# Patient Record
Sex: Female | Born: 1970 | Race: White | Hispanic: No | Marital: Married | Smoking: Never smoker
Health system: Southern US, Community
[De-identification: ages and names within clinical notes are randomized; demographics above are authoritative.]

## PROBLEM LIST (undated history)

## (undated) DIAGNOSIS — M25512 Pain in left shoulder: Secondary | ICD-10-CM

## (undated) DIAGNOSIS — M1990 Unspecified osteoarthritis, unspecified site: Secondary | ICD-10-CM

## (undated) DIAGNOSIS — K635 Polyp of colon: Secondary | ICD-10-CM

## (undated) DIAGNOSIS — K2960 Other gastritis without bleeding: Secondary | ICD-10-CM

## (undated) DIAGNOSIS — Z0000 Encounter for general adult medical examination without abnormal findings: Secondary | ICD-10-CM

## (undated) DIAGNOSIS — K219 Gastro-esophageal reflux disease without esophagitis: Secondary | ICD-10-CM

## (undated) DIAGNOSIS — B019 Varicella without complication: Secondary | ICD-10-CM

## (undated) DIAGNOSIS — L918 Other hypertrophic disorders of the skin: Secondary | ICD-10-CM

## (undated) DIAGNOSIS — R911 Solitary pulmonary nodule: Secondary | ICD-10-CM

## (undated) DIAGNOSIS — G43909 Migraine, unspecified, not intractable, without status migrainosus: Secondary | ICD-10-CM

## (undated) DIAGNOSIS — M79671 Pain in right foot: Secondary | ICD-10-CM

## (undated) DIAGNOSIS — K649 Unspecified hemorrhoids: Secondary | ICD-10-CM

## (undated) DIAGNOSIS — J40 Bronchitis, not specified as acute or chronic: Secondary | ICD-10-CM

## (undated) DIAGNOSIS — N200 Calculus of kidney: Secondary | ICD-10-CM

## (undated) DIAGNOSIS — K5790 Diverticulosis of intestine, part unspecified, without perforation or abscess without bleeding: Secondary | ICD-10-CM

## (undated) DIAGNOSIS — I517 Cardiomegaly: Secondary | ICD-10-CM

## (undated) DIAGNOSIS — N83201 Unspecified ovarian cyst, right side: Secondary | ICD-10-CM

## (undated) DIAGNOSIS — R030 Elevated blood-pressure reading, without diagnosis of hypertension: Secondary | ICD-10-CM

## (undated) DIAGNOSIS — R05 Cough: Secondary | ICD-10-CM

## (undated) DIAGNOSIS — F418 Other specified anxiety disorders: Secondary | ICD-10-CM

## (undated) DIAGNOSIS — E669 Obesity, unspecified: Secondary | ICD-10-CM

## (undated) HISTORY — DX: Unspecified hemorrhoids: K64.9

## (undated) HISTORY — DX: Cough: R05

## (undated) HISTORY — DX: Unspecified ovarian cyst, right side: N83.201

## (undated) HISTORY — DX: Cardiomegaly: I51.7

## (undated) HISTORY — DX: Varicella without complication: B01.9

## (undated) HISTORY — DX: Other gastritis without bleeding: K29.60

## (undated) HISTORY — DX: Obesity, unspecified: E66.9

## (undated) HISTORY — DX: Solitary pulmonary nodule: R91.1

## (undated) HISTORY — DX: Pain in left shoulder: M25.512

## (undated) HISTORY — PX: TONSILECTOMY, ADENOIDECTOMY, BILATERAL MYRINGOTOMY AND TUBES: SHX2538

## (undated) HISTORY — DX: Bronchitis, not specified as acute or chronic: J40

## (undated) HISTORY — DX: Polyp of colon: K63.5

## (undated) HISTORY — DX: Unspecified osteoarthritis, unspecified site: M19.90

## (undated) HISTORY — DX: Gastro-esophageal reflux disease without esophagitis: K21.9

## (undated) HISTORY — DX: Pain in right foot: M79.671

## (undated) HISTORY — DX: Other specified anxiety disorders: F41.8

## (undated) HISTORY — DX: Other hypertrophic disorders of the skin: L91.8

## (undated) HISTORY — DX: Encounter for general adult medical examination without abnormal findings: Z00.00

## (undated) HISTORY — DX: Migraine, unspecified, not intractable, without status migrainosus: G43.909

## (undated) HISTORY — DX: Elevated blood-pressure reading, without diagnosis of hypertension: R03.0

## (undated) HISTORY — DX: Calculus of kidney: N20.0

## (undated) HISTORY — DX: Diverticulosis of intestine, part unspecified, without perforation or abscess without bleeding: K57.90

## (undated) HISTORY — PX: OVARIAN CYST REMOVAL: SHX89

---

## 2006-11-03 DIAGNOSIS — K2960 Other gastritis without bleeding: Secondary | ICD-10-CM

## 2006-11-03 DIAGNOSIS — K635 Polyp of colon: Secondary | ICD-10-CM

## 2006-11-03 DIAGNOSIS — K5790 Diverticulosis of intestine, part unspecified, without perforation or abscess without bleeding: Secondary | ICD-10-CM

## 2006-11-03 DIAGNOSIS — K219 Gastro-esophageal reflux disease without esophagitis: Secondary | ICD-10-CM

## 2006-11-03 HISTORY — DX: Polyp of colon: K63.5

## 2006-11-03 HISTORY — DX: Gastro-esophageal reflux disease without esophagitis: K21.9

## 2006-11-03 HISTORY — DX: Other gastritis without bleeding: K29.60

## 2006-11-03 HISTORY — DX: Diverticulosis of intestine, part unspecified, without perforation or abscess without bleeding: K57.90

## 2006-11-03 HISTORY — PX: LITHOTRIPSY: SUR834

## 2010-07-04 LAB — HM MAMMOGRAPHY

## 2010-07-04 LAB — HM PAP SMEAR

## 2010-09-24 ENCOUNTER — Ambulatory Visit: Payer: Self-pay | Admitting: Obstetrics & Gynecology

## 2010-10-08 ENCOUNTER — Encounter: Admission: RE | Admit: 2010-10-08 | Discharge: 2010-10-08 | Payer: Self-pay | Admitting: Anesthesiology

## 2011-02-19 ENCOUNTER — Encounter: Payer: Self-pay | Admitting: Family Medicine

## 2011-02-19 ENCOUNTER — Ambulatory Visit (INDEPENDENT_AMBULATORY_CARE_PROVIDER_SITE_OTHER): Payer: BC Managed Care – PPO | Admitting: Family Medicine

## 2011-02-19 VITALS — BP 114/72 | HR 72 | Ht 63.0 in | Wt 206.0 lb

## 2011-02-19 DIAGNOSIS — J309 Allergic rhinitis, unspecified: Secondary | ICD-10-CM | POA: Insufficient documentation

## 2011-02-19 DIAGNOSIS — G43009 Migraine without aura, not intractable, without status migrainosus: Secondary | ICD-10-CM

## 2011-02-19 DIAGNOSIS — Z309 Encounter for contraceptive management, unspecified: Secondary | ICD-10-CM

## 2011-02-19 MED ORDER — ETONOGESTREL-ETHINYL ESTRADIOL 0.12-0.015 MG/24HR VA RING: 1.0000 | VAGINAL_RING | VAGINAL | Status: DC

## 2011-02-19 MED ORDER — RIZATRIPTAN BENZOATE 10 MG PO TBDP: 10.0000 mg | ORAL_TABLET | ORAL | Status: DC | PRN

## 2011-02-19 MED ORDER — FLUTICASONE PROPIONATE 50 MCG/ACT NA SUSP: 2.0000 | Freq: Every day | NASAL | Status: DC

## 2011-02-19 NOTE — Assessment & Plan Note (Signed)
She can  continue nasal steroid but did recommend trying an oral antihistamine. She can also try nasal saline rinse twice a day to also help with the postnasal drip. If this does not improve her symptoms over the next month we can consider a nasal antihistamine.

## 2011-02-19 NOTE — Assessment & Plan Note (Signed)
Overall she is happy with her current regimen like a refill on Maxalt. She says she's tried several tryptans and rthis has worked best for her.

## 2011-02-19 NOTE — Progress Notes (Signed)
  Subjective:    Patient ID: Lisa Ray, female    DOB: 01-04-1971, 40 y.o.   MRN: [id number]  HPI  Has had chronic post nasal drip. Has severe allergies.  She has been using nasal steroid spray. She's not taking any means.  Her allergies have actually been better here.     Had her pap and mammo down the hall. Thinks she wasn't bith control.  Has had in IUD in the past.  She has gained a lot of weight in the last year.  Moved her from [place] for her job.  Her job is more sedentary.  No regular exercise.   Review of Systems  Constitutional: Negative for fever, diaphoresis and unexpected weight change.  HENT: Negative for hearing loss, rhinorrhea, sneezing, postnasal drip and tinnitus.   Eyes: Negative for visual disturbance.  Respiratory: Negative for cough and wheezing.   Cardiovascular: Negative for chest pain and palpitations.  Gastrointestinal: Negative for nausea, vomiting, abdominal pain, diarrhea and blood in stool.  Genitourinary: Negative for vaginal bleeding, vaginal discharge and difficulty urinating.  Musculoskeletal: Negative for myalgias and arthralgias.  Skin: Negative for rash.  Neurological: Positive for headaches.  Hematological: Negative for adenopathy. Does not bruise/bleed easily.  Psychiatric/Behavioral: Negative for sleep disturbance and dysphoric mood. The patient is not nervous/anxious.        Objective:   Physical Exam  Constitutional: She appears well-developed and well-nourished.  HENT:  Head: Normocephalic and atraumatic.  Neck: Neck supple. No thyromegaly present.  Cardiovascular: Normal rate, regular rhythm and normal heart sounds.   Pulmonary/Chest: Effort normal and breath sounds normal.  Lymphadenopathy:    She has no cervical adenopathy.  Skin: Skin is warm.  Psychiatric: She has a normal mood and affect.          Assessment & Plan:  Contraceptive counseling-we discussed different forms of birth control and she would like to try  the Nuvaring.

## 2011-03-18 NOTE — Assessment & Plan Note (Signed)
NAME:  Lisa Ray, Lisa Ray             ACCOUNT NO.:  [id number]   MEDICAL RECORD NO.:  [id number]          PATIENT TYPE:  POB   LOCATION:  [facility] [place]         FACILITY:  [facility]   PHYSICIAN:  Jaynie Collins, MD     DATE OF BIRTH:  Dec 29, 1970   DATE OF SERVICE:  09/24/2010                                  CLINIC NOTE   REASON FOR VISIT:  Annual examination, patient also wants preconceptual  counseling.   The patient is a 40 year old gravida 3, para 3 with a last menstrual  period of September 02, 2010, who is here for annual gynecologic exam.  The patient also reports that she wants to try to have another baby and  wants basic preconception counseling regarding this matter.  She was on  oral contraceptive pills for several years and have stopped it within  the last month.  She did have irregular bleeding after she stopped her  pills, but no other concerns.   PAST OBSTETRIC/GYNECOLOGIC HISTORY:  The patient is a G3, P3 with three  vaginal deliveries.  Her last pregnancy was 13 years ago.  As for her  menstrual history, she had menarche at age 25, she has regular menstrual  periods and her periods lasts for 4 days associated with medium flow and  mild pain and no intermenstrual bleeding, but patient does report she  has been on pills for a long time.  The patient denies having any  abnormal Pap smears or sexually transmitted infections.  Her last Pap  smear was in 2010.  She has had symptomatic ovarian cyst needing surgery  in 2008.   PAST MEDICAL HISTORY:  Diverticulitis, severe gastroesophageal reflux  disease/erosive gastritis, migraines.   PAST SURGICAL HISTORY:  Ovarian cystectomy and tonsillectomy.   MEDICATIONS:  Allegra, Aleve, Maxalt, and multivitamin.   ALLERGIES:  No known drug allergies.   SOCIAL HISTORY:  The patient works in HR.  She lives with her fiance,  three kids, and one grandson.  She does not smoke.  She drinks three  alcoholic beverages a week and does  not use any illicit drugs.  She  denies any past or current history of sexual or physical abuse.   FAMILY HISTORY:  Remarkable for her grandmother and grandfather having  colon and skin cancer and her mother and uncle having skin cancer.  She  does have an extensive family history of high blood pressure, heart  disease, and also her grandmother and her mother had blood clots in  their legs.   REVIEW OF SYSTEMS:  Unremarkable for headache and cough.   PHYSICAL EXAMINATION:  VITAL SIGNS:  Blood pressure is 115/73, pulse 92,  weight 203 pounds, height 62 inches.  GENERAL:  No apparent distress.  HEENT:  Normocephalic, atraumatic.  NECK:  Supple.  Normal thyroid.  LUNGS:  Clear to auscultation bilaterally.  HEART:  Regular rate and rhythm.  BREASTS:  Symmetric in size, nontender, no abnormal masses, skin  changes, lymphadenopathy, and nipple drainage noted.  ABDOMEN:  Soft,  nontender, and nondistended.  EXTREMITIES:  No cyanosis, clubbing, or edema.  PELVIC:  Normal external female genitalia.  Pink, well rugated vagina.  Normal cervical contour.  Pap smear  was obtained.  Uterus was not able  to be fully palpated secondary to habitus.  The adnexa also were not  fully palpated secondary to habitus, but no abnormal masses were  palpated.  No tenderness on bimanual examination.   ASSESSMENT AND PLAN:  The patient is a 40 year old gravida 3, para 3 who  is here for annual exam for preconception counseling.  As for her annual  exam, the patient did have a breast exam that was normal today and will  be scheduled for mammogram at the end of this visit.  We will also  follow up the results of her Pap smears.  The rest of her health care  maintenance is up-to-date.  As for her preconceptual counseling, the  patient was counseled regarding the risk of aneuploidy with advanced  maternal age and was given printed information about having over 50%  risk of having spontaneous abortion at age 52 and  an overall almost 16%  risk of having an aneuploidy.  There is also increased risk of maternal  morbidity and mortality and increased risk of stillbirths with advanced  maternal age.  The patient does verbalize understanding of this.  She  was told to make sure she is taking a multivitamin with at least 800 mcg  of folic acid in it and also to avoid anything that could potentially be  a teratogen.  The medications that she is on class B and class C  medications and she can continue on dose for now.  She does not have any  chronic medical conditions that are concerning at this point, but she  was told that she will be at increased risk for gestational diabetes,  hypertension, and other maternal conditions if and when she does get  pregnant.  The patient is going to get a flu vaccination today.  She was  told that she should try to get pregnant and but if she does not succeed  within 6 months, she will need further evaluation and might even need a  specialist referral after the basic evaluation is done here.  The use of  ovulation predictor kits was also reviewed and recommended.           ______________________________  Jaynie Collins, MD     UA/MEDQ  D:  09/24/2010  T:  09/25/2010  Job:  [id number]

## 2011-03-21 ENCOUNTER — Ambulatory Visit: Payer: BC Managed Care – PPO

## 2011-04-29 ENCOUNTER — Encounter: Payer: Self-pay | Admitting: Family Medicine

## 2011-04-29 ENCOUNTER — Inpatient Hospital Stay (INDEPENDENT_AMBULATORY_CARE_PROVIDER_SITE_OTHER)
Admission: RE | Admit: 2011-04-29 | Discharge: 2011-04-29 | Disposition: A | Discharge status: Home / Self Care | Payer: BC Managed Care – PPO | Source: Ambulatory Visit | Attending: Family Medicine | Admitting: Family Medicine

## 2011-04-29 ENCOUNTER — Other Ambulatory Visit: Payer: Self-pay | Admitting: Family Medicine

## 2011-04-29 ENCOUNTER — Ambulatory Visit
Admission: RE | Admit: 2011-04-29 | Discharge: 2011-04-29 | Disposition: A | Discharge status: Home / Self Care | Payer: BC Managed Care – PPO | Source: Ambulatory Visit | Attending: Family Medicine | Admitting: Family Medicine

## 2011-04-29 DIAGNOSIS — R109 Unspecified abdominal pain: Secondary | ICD-10-CM

## 2011-04-29 DIAGNOSIS — N200 Calculus of kidney: Secondary | ICD-10-CM | POA: Insufficient documentation

## 2011-04-29 LAB — CONVERTED CEMR LAB
Bilirubin Urine: NEGATIVE
Glucose, Urine, Semiquant: NEGATIVE
Ketones, urine, test strip: NEGATIVE
Nitrite: NEGATIVE
Protein, U semiquant: NEGATIVE
Specific Gravity, Urine: 1.01
Urobilinogen, UA: 0.2
WBC Urine, dipstick: NEGATIVE
pH: 5.5

## 2011-05-01 ENCOUNTER — Telehealth (INDEPENDENT_AMBULATORY_CARE_PROVIDER_SITE_OTHER): Payer: Self-pay | Admitting: Emergency Medicine

## 2011-05-04 DIAGNOSIS — N200 Calculus of kidney: Secondary | ICD-10-CM

## 2011-05-04 HISTORY — PX: OTHER SURGICAL HISTORY: SHX169

## 2011-05-04 HISTORY — DX: Calculus of kidney: N20.0

## 2011-05-29 ENCOUNTER — Telehealth: Payer: Self-pay | Admitting: Family Medicine

## 2011-05-29 NOTE — Telephone Encounter (Signed)
Pt called that she needs to come in for a wet prep for a nurse visit.  Sched while pt on phone. Jarvis Newcomer, LPN Domingo Dimes

## 2011-05-29 NOTE — Telephone Encounter (Signed)
Pt was recently given ab tx for kidney stones and has stint placement from a urologist.  Now she is complaining of itching and some discharge.  Uses [facility].  Wants to know if she can get script for diflucan 150 mg?  Please advise. Jarvis Newcomer, LPN Domingo Dimes

## 2011-05-29 NOTE — Telephone Encounter (Signed)
Needs to come for wet prep.  Can put on nurse visit .

## 2011-05-30 ENCOUNTER — Ambulatory Visit (INDEPENDENT_AMBULATORY_CARE_PROVIDER_SITE_OTHER): Payer: BC Managed Care – PPO | Admitting: Family Medicine

## 2011-05-30 ENCOUNTER — Telehealth: Payer: Self-pay | Admitting: *Deleted

## 2011-05-30 DIAGNOSIS — B3731 Acute candidiasis of vulva and vagina: Secondary | ICD-10-CM

## 2011-05-30 DIAGNOSIS — B373 Candidiasis of vulva and vagina: Secondary | ICD-10-CM

## 2011-05-30 MED ORDER — FLUCONAZOLE 150 MG PO TABS: 150.0000 mg | ORAL_TABLET | Freq: Once | ORAL | Status: AC

## 2011-05-30 NOTE — Telephone Encounter (Signed)
Ok will send over diflucan 

## 2011-05-30 NOTE — Telephone Encounter (Signed)
Pt came in the office today for a nurse visit for possible yeast infection. Very uncomfortable. Pt thought we could give her a ex today. Pt is going out of town. Adivsed pt that we would need to see what the results were on wet prep to confirm yeast before we could call in a rx. Pt wanted me to ask Dr. Any way if something could be called in since she was going out of town. Please advise

## 2011-05-30 NOTE — Telephone Encounter (Signed)
Left message on pt vm. 

## 2011-05-30 NOTE — Progress Notes (Signed)
  Subjective:    Patient ID: Lisa Ray, female    DOB: 03-May-1971, 40 y.o.   MRN: [id number]  HPI  Burning and itching in the vaginal area x 3 days. Minimal discharge  Review of Systems     Objective:   Physical Exam        Assessment & Plan:   Patient says she is leaving town [employer] [place]. So, and Diflucan I will call her Monday with the results of the wet prep.

## 2011-06-02 LAB — WET PREP, GENITAL: Clue Cells Wet Prep HPF POC: NONE SEEN

## 2011-06-03 ENCOUNTER — Telehealth: Payer: Self-pay | Admitting: Family Medicine

## 2011-06-03 NOTE — Telephone Encounter (Signed)
Pt.notified

## 2011-06-03 NOTE — Telephone Encounter (Signed)
Call pt: wet pre showed + yeast infection. Dfilucan should have cleared it up.

## 2011-10-06 NOTE — Progress Notes (Signed)
Summary: ? kidney stone rm 4   Vital Signs:  Patient Profile:   40 Years Old Female CC:      possible kidney stone LMP:     04/15/2011 Height:     62.5 inches Weight:      207.50 pounds O2 treatment:    Room Air Temp:     98.1 degrees F oral Pulse rate:   74 / minute Resp:     20 per minute BP sitting:   137 / 89  (left arm) Cuff size:   regular  Pt. in pain?   yes    Location:   lower back    Intensity:   8-9    Type:       sharp/ stabbing  Vitals Entered By: Clemens Catholic LPN (April 29, 2011 12:56 PM)  Menstrual History: LMP (date): 04/15/2011                   Updated Prior Medication List: No Medications Current Allergies: No known allergies History of Present Illness Chief Complaint: possible kidney stone History of Present Illness:  Subjective:  Patient complains of onset two hours ago of right lower back and flank pain associated with episode of nausea/vomiting.  No fevers, chills, and sweats.  No urinary symptoms.  She has a past history of right renal stone in 2008 that required stenting, and her present symptoms are similar.  REVIEW OF SYSTEMS Constitutional Symptoms      Denies fever, chills, night sweats, weight loss, weight gain, and fatigue.  Eyes       Denies change in vision, eye pain, eye discharge, glasses, contact lenses, and eye surgery. Ear/Nose/Throat/Mouth       Denies hearing loss/aids, change in hearing, ear pain, ear discharge, dizziness, frequent runny nose, frequent nose bleeds, sinus problems, sore throat, hoarseness, and tooth pain or bleeding.  Respiratory       Denies dry cough, productive cough, wheezing, shortness of breath, asthma, bronchitis, and emphysema/COPD.  Cardiovascular       Denies murmurs, chest pain, and tires easily with exhertion.    Gastrointestinal       Complains of stomach pain.      Denies nausea/vomiting, diarrhea, constipation, blood in bowel movements, and indigestion.      Comments:  nausea Genitourniary       Denies painful urination, kidney stones, and loss of urinary control. Neurological       Denies paralysis, seizures, and fainting/blackouts. Musculoskeletal       Denies muscle pain, joint pain, joint stiffness, decreased range of motion, redness, swelling, muscle weakness, and gout.  Skin       Denies bruising, unusual mles/lumps or sores, and hair/skin or nail changes.  Psych       Denies mood changes, temper/anger issues, anxiety/stress, speech problems, depression, and sleep problems. Other Comments: pt c/o RT sided low back pain radiating around to RLQ abd x 1 hour. she also is nauseated. she has a hx of kidney stones. [place] and her urologist was there. no OTC meds. no fever.   Past History:  Past Medical History: Unremarkable  Past Surgical History: ovarian cyst removal  Family History: skin CA and colon CA  Social History: Never Smoked Alcohol use-no Drug use-no Smoking Status:  never Drug Use:  no   Objective:  Appearance:  Patient appears uncomfortable, but otherwise healthy, stated age, and in no acute distress  Eyes:  Pupils are equal, round, and  reactive to light and accomodation.  Extraocular movement is intact.  Conjunctivae are not inflamed.  Mouth:  moist mucous membranes  Neck:  Supple.  No adenopathy is present.   Lungs:  Clear to auscultation.  Breath sounds are equal.  Heart:  Regular rate and rhythm without murmurs, rubs, or gallops.  Abdomen:  Mild tenderness right CVA and flank area without masses or hepatosplenomegaly.  Bowel sounds are present.    Extremities:  No edema.  Skin:  No rash urinalysis (dipstick):  2+ blood CT scan abdomen and pelvis (w/o contrast):  IMPRESSION: 2.5 mm stone in the mid right ureter at the L3-4 level causing mild right hydronephrosis.  Tiny stone in the upper pole of the right kidney.  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  Plan New Medications/Changes: PROMETHAZINE HCL 25 MG TABS (PROMETHAZINE HCL) 1 by mouth q4 to 6hr as needed nausea  #12 x 0, 04/29/2011, Donna Christen MD LORTAB 7.5 7.5-500 MG TABS (HYDROCODONE-ACETAMINOPHEN) 1 by mouth q6hr as needed pain  #12 (twelve) x 0, 04/29/2011, Donna Christen MD FLOMAX 0.4 MG CAPS (TAMSULOSIN HCL) One by mouth once daily pc  #10 x 1, 04/29/2011, Donna Christen MD  New Orders: T-CT Abdomen/pelvis w/o [16109] Ketorolac-Toradol 15mg  [U0454] Admin of Therapeutic Inj  intramuscular or subcutaneous [96372] Urinalysis [CPT-81003] New Patient Level V [99205] Planning Comments:   Toradol 60mg  IM.  Begin Flomax.  Increase fluid intake.  Strain urine.  Lortab and Phenergan for pain and nausea. Follow-up with urologist tomorrow.   The patient and/or caregiver has been counseled thoroughly with regard to medications prescribed including dosage, schedule, interactions, rationale for use, and possible side effects and they verbalize understanding.  Diagnoses and expected course of recovery discussed and will return if not improved as expected or if the condition worsens. Patient and/or caregiver verbalized understanding.  Prescriptions: PROMETHAZINE HCL 25 MG TABS (PROMETHAZINE HCL) 1 by mouth q4 to 6hr as needed nausea  #12 x 0   Entered and Authorized by:   Donna Christen MD   Signed by:   Donna Christen MD on 04/29/2011   Method used:   Print then Give to Patient   RxID:   [id number] LORTAB 7.5 7.5-500 MG TABS (HYDROCODONE-ACETAMINOPHEN) 1 by mouth q6hr as needed pain  #12 (twelve) x 0   Entered and Authorized by:   Donna Christen MD   Signed by:   Donna Christen MD on 04/29/2011   Method used:   Print then Give to Patient   RxID:   [id number] FLOMAX 0.4 MG CAPS (TAMSULOSIN HCL) One by mouth once daily pc  #10 x 1   Entered and Authorized by:    Donna Christen MD   Signed by:   Donna Christen MD on 04/29/2011   Method used:   Print then Give to Patient   RxID:   [id number]   Medication Administration  Injection # 1:    Medication: Ketorolac-Toradol 15mg     Diagnosis: NEPHROLITHIASIS, 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)    Route: IM    Site: LUOQ gluteus    Exp Date: 02/01/2013    Lot #: [id number]    Mfr: hospira    Comments: 60 mg given     Patient tolerated injection without complications    Given by: Clemens Catholic LPN (April 29, 2011 1:49 PM)  Orders Added: 1)  T-CT Abdomen/pelvis w/o [74176] 2)  Ketorolac-Toradol 15mg  [J1885] 3)  Admin  of Therapeutic Inj  intramuscular or subcutaneous [96372] 4)  Urinalysis [CPT-81003] 5)  New Patient Level V [99205]    Laboratory Results   Urine Tests  Date/Time Received: April 29, 2011 1:11 PM  Date/Time Reported: April 29, 2011 1:11 PM   Routine Urinalysis   Color: yellow Appearance: Clear Glucose: negative   (Normal Range: Negative) Bilirubin: negative   (Normal Range: Negative) Ketone: negative   (Normal Range: Negative) Spec. Gravity: 1.010   (Normal Range: 1.003-1.035) Blood: 2+   (Normal Range: Negative) pH: 5.5   (Normal Range: 5.0-8.0) Protein: negative   (Normal Range: Negative) Urobilinogen: 0.2   (Normal Range: 0-1) Nitrite: negative   (Normal Range: Negative) Leukocyte Esterace: negative   (Normal Range: Negative)

## 2011-10-06 NOTE — Telephone Encounter (Signed)
  Phone Note Outgoing Call Call back at [facility] Phone [phone] P [facility]     Call placed by: Emilio Math,  May 01, 2011 2:07 PM Call placed to: Patient Summary of Call: Left msg hope she is feeling better, call with questions or concerns.

## 2011-10-06 NOTE — Letter (Signed)
Summary: Out of Work  [facility]  [address]   [place], [place] [address]   Phone: [phone]  Fax: [phone]    April 29, 2011   Employee:  Lisa Ray    To Whom It May Concern:   For Medical reasons, please excuse the above named employee from work today and tomorrow.   If you need additional information, please feel free to contact our office.         Sincerely,    Donna Christen MD

## 2011-10-08 ENCOUNTER — Encounter: Payer: Self-pay | Admitting: Family Medicine

## 2011-10-08 ENCOUNTER — Other Ambulatory Visit: Payer: Self-pay | Admitting: Family Medicine

## 2011-10-08 ENCOUNTER — Ambulatory Visit (INDEPENDENT_AMBULATORY_CARE_PROVIDER_SITE_OTHER): Payer: BC Managed Care – PPO | Admitting: Family Medicine

## 2011-10-08 VITALS — BP 117/80 | HR 85 | Temp 97.9°F | Ht 63.0 in | Wt 208.8 lb

## 2011-10-08 DIAGNOSIS — R1013 Epigastric pain: Secondary | ICD-10-CM

## 2011-10-08 DIAGNOSIS — K5792 Diverticulitis of intestine, part unspecified, without perforation or abscess without bleeding: Secondary | ICD-10-CM | POA: Insufficient documentation

## 2011-10-08 DIAGNOSIS — K3189 Other diseases of stomach and duodenum: Secondary | ICD-10-CM

## 2011-10-08 DIAGNOSIS — G43909 Migraine, unspecified, not intractable, without status migrainosus: Secondary | ICD-10-CM | POA: Insufficient documentation

## 2011-10-08 DIAGNOSIS — N83201 Unspecified ovarian cyst, right side: Secondary | ICD-10-CM

## 2011-10-08 DIAGNOSIS — Z23 Encounter for immunization: Secondary | ICD-10-CM

## 2011-10-08 DIAGNOSIS — M79609 Pain in unspecified limb: Secondary | ICD-10-CM

## 2011-10-08 DIAGNOSIS — R109 Unspecified abdominal pain: Secondary | ICD-10-CM

## 2011-10-08 DIAGNOSIS — K2960 Other gastritis without bleeding: Secondary | ICD-10-CM | POA: Insufficient documentation

## 2011-10-08 DIAGNOSIS — N200 Calculus of kidney: Secondary | ICD-10-CM | POA: Insufficient documentation

## 2011-10-08 DIAGNOSIS — J309 Allergic rhinitis, unspecified: Secondary | ICD-10-CM

## 2011-10-08 DIAGNOSIS — E669 Obesity, unspecified: Secondary | ICD-10-CM | POA: Insufficient documentation

## 2011-10-08 DIAGNOSIS — Z0000 Encounter for general adult medical examination without abnormal findings: Secondary | ICD-10-CM

## 2011-10-08 DIAGNOSIS — M79671 Pain in right foot: Secondary | ICD-10-CM

## 2011-10-08 DIAGNOSIS — K635 Polyp of colon: Secondary | ICD-10-CM | POA: Insufficient documentation

## 2011-10-08 DIAGNOSIS — N83209 Unspecified ovarian cyst, unspecified side: Secondary | ICD-10-CM

## 2011-10-08 DIAGNOSIS — K219 Gastro-esophageal reflux disease without esophagitis: Secondary | ICD-10-CM

## 2011-10-08 HISTORY — DX: Obesity, unspecified: E66.9

## 2011-10-08 MED ORDER — RANITIDINE HCL 300 MG PO TABS: 300.0000 mg | ORAL_TABLET | Freq: Every day | ORAL | Status: DC

## 2011-10-08 MED ORDER — HYOSCYAMINE SULFATE 0.125 MG SL SUBL: 0.1250 mg | SUBLINGUAL_TABLET | SUBLINGUAL | Status: AC | PRN

## 2011-10-08 MED ORDER — RIZATRIPTAN BENZOATE 10 MG PO TABS: 10.0000 mg | ORAL_TABLET | ORAL | Status: DC | PRN

## 2011-10-08 NOTE — Patient Instructions (Signed)
Gastroesophageal Reflux Disease, Adult Gastroesophageal reflux disease (GERD) happens when acid from your stomach flows up into the esophagus. When acid comes in contact with the esophagus, the acid causes soreness (inflammation) in the esophagus. Over time, GERD may create small holes (ulcers) in the lining of the esophagus. CAUSES   Increased body weight. This puts pressure on the stomach, making acid rise from the stomach into the esophagus.   Smoking. This increases acid production in the stomach.   Drinking alcohol. This causes decreased pressure in the lower esophageal sphincter (valve or ring of muscle between the esophagus and stomach), allowing acid from the stomach into the esophagus.   Late evening meals and a full stomach. This increases pressure and acid production in the stomach.   A malformed lower esophageal sphincter.  Sometimes, no cause is found. SYMPTOMS   Burning pain in the lower part of the mid-chest behind the breastbone and in the mid-stomach area. This may occur twice a week or more often.   Trouble swallowing.   Sore throat.   Dry cough.   Asthma-like symptoms including chest tightness, shortness of breath, or wheezing.  DIAGNOSIS  Your caregiver may be able to diagnose GERD based on your symptoms. In some cases, X-rays and other tests may be done to check for complications or to check the condition of your stomach and esophagus. TREATMENT  Your caregiver may recommend over-the-counter or prescription medicines to help decrease acid production. Ask your caregiver before starting or adding any new medicines.  HOME CARE INSTRUCTIONS   Change the factors that you can control. Ask your caregiver for guidance concerning weight loss, quitting smoking, and alcohol consumption.   Avoid foods and drinks that make your symptoms worse, such as:   Caffeine or alcoholic drinks.   Chocolate.   Peppermint or mint flavorings.   Garlic and onions.   Spicy foods.     Citrus fruits, such as oranges, lemons, or limes.   Tomato-based foods such as sauce, chili, salsa, and pizza.   Fried and fatty foods.   Avoid lying down for the 3 hours prior to your bedtime or prior to taking a nap.   Eat small, frequent meals instead of large meals.   Wear loose-fitting clothing. Do not wear anything tight around your waist that causes pressure on your stomach.   Raise the head of your bed 6 to 8 inches with wood blocks to help you sleep. Extra pillows will not help.   Only take over-the-counter or prescription medicines for pain, discomfort, or fever as directed by your caregiver.   Do not take aspirin, ibuprofen, or other nonsteroidal anti-inflammatory drugs (NSAIDs).  SEEK IMMEDIATE MEDICAL CARE IF:   You have pain in your arms, neck, jaw, teeth, or back.   Your pain increases or changes in intensity or duration.   You develop nausea, vomiting, or sweating (diaphoresis).   You develop shortness of breath, or you faint.   Your vomit is green, yellow, black, or looks like coffee grounds or blood.   Your stool is red, bloody, or black.  These symptoms could be signs of other problems, such as heart disease, gastric bleeding, or esophageal bleeding. MAKE SURE YOU:   Understand these instructions.   Will watch your condition.   Will get help right away if you are not doing well or get worse.  Document Released: 07/30/2005 Document Revised: 07/02/2011 Document Reviewed: 05/09/2011 [facility] Patient Information 2012 [place], [place].   Avoid spicy foods Start a probiotic such  as Align caps daily, add a  Yogurt daily, consider minimizing gluten. Add A fiber supplement For right heel spur, apply ice twice daily and then apply Aspercreme, stretch, use good shoes and get gel shoe inserts

## 2011-10-09 LAB — HEPATIC FUNCTION PANEL
AST: 17 U/L (ref 0–37)
Albumin: 4.4 g/dL (ref 3.5–5.2)
Alkaline Phosphatase: 76 U/L (ref 39–117)
Total Protein: 7.1 g/dL (ref 6.0–8.3)

## 2011-10-09 LAB — CBC
Hemoglobin: 14.2 g/dL (ref 12.0–15.0)
MCH: 28.7 pg (ref 26.0–34.0)
MCV: 85.8 fL (ref 78.0–100.0)
WBC: 8.2 10*3/uL (ref 4.0–10.5)

## 2011-10-09 LAB — BASIC METABOLIC PANEL
BUN: 10 mg/dL (ref 6–23)
CO2: 25 mEq/L (ref 19–32)
Calcium: 9.7 mg/dL (ref 8.4–10.5)
Creat: 0.77 mg/dL (ref 0.50–1.10)
Potassium: 4.4 mEq/L (ref 3.5–5.3)
Sodium: 135 mEq/L (ref 135–145)

## 2011-10-09 LAB — LIPID PANEL: HDL: 49 mg/dL (ref >39)

## 2011-10-13 ENCOUNTER — Encounter: Payer: Self-pay | Admitting: Family Medicine

## 2011-10-13 DIAGNOSIS — N83201 Unspecified ovarian cyst, right side: Secondary | ICD-10-CM | POA: Insufficient documentation

## 2011-10-13 DIAGNOSIS — M79671 Pain in right foot: Secondary | ICD-10-CM

## 2011-10-13 DIAGNOSIS — Z0000 Encounter for general adult medical examination without abnormal findings: Secondary | ICD-10-CM

## 2011-10-13 HISTORY — DX: Encounter for general adult medical examination without abnormal findings: Z00.00

## 2011-10-13 HISTORY — DX: Pain in right foot: M79.671

## 2011-10-13 NOTE — Assessment & Plan Note (Addendum)
Tdap given today, avoid trans fats, get 7-8 sleep, try DASH diet.

## 2011-10-13 NOTE — Assessment & Plan Note (Signed)
Requesting a change to the Maxalt tab from the MLT. No trouble with frequent HA, encouraged adequate hydration, sleep and exercise

## 2011-10-13 NOTE — Assessment & Plan Note (Signed)
Pain at base of foot c/w fasciitis, encouraged no foot wear without backs, good shoes with arch support and gel shoe inserts, apply ice bid and Aspercreme, may use ALeve prn and report worsening symptoms

## 2011-10-13 NOTE — Progress Notes (Signed)
Lisa Ray [id number] 08/15/1971 10/13/2011      Progress Note New Patient  Subjective  Chief Complaint  Chief Complaint  Patient presents with  . Establish Care    new patient    HPI  40 year old Caucasian female who is in today for new patient appointment. Her major complaint is GI related. She'll long history of what sounds like irritable bowel symptoms with intermittent diarrhea and constipation. She reports that she'll have 4 loose crampiness bowel movements and then should go for several days without a bowel movement. She denies any bloody or tarry stool however. She reports nearly daily cramping in her lower abdomen intermittently. Sometimes it is severe it caused nausea and sweatiness and other times less severe. It is not associated with eating or bowel movements. She has a long history of intermittent migraines which are adequately controlled most of the time he stayed she is requesting a switch from Maxalt MLT to the regular pills however. She denies any recent fevers, chills, congestion, allergies, chest pain, palpitations, shortness of breath, GU complaints. She is struggling with some right foot pain she says this been going on for a month. 3 painful to step on her for foot especially first thing in the morning. When she is up for a while it does improve. She is using TUMS and Maalox when necessary heartburn with some affect. Gets a burning sour taste at times. She uses Aleve for her foot pain intermittently as well.  Past Medical History  Diagnosis Date  . Migraines   . Chicken pox as a child    X 2  . Kidney stones 7-12    X 3  . Diverticulitis 2008  . Colon polyps 2008  . GERD (gastroesophageal reflux disease) 2008  . Erosive inflammation of stomach lining 2008  . Obesity 10/08/2011  . Preventative health care 10/13/2011  . Foot pain, right 10/13/2011  . Ovarian cyst, right     Past Surgical History  Procedure Date  . Lithotripsy 2008  . Tonsilectomy,  adenoidectomy, bilateral myringotomy and tubes     Age 79  . Stent for kidney stone 7-12    Family History  Problem Relation Age of Onset  . Skin cancer    . Hypertension Mother   . Cancer Mother     skin, melanoma  . Migraines Mother   . Heart disease Maternal Grandmother   . Cancer Maternal Grandmother     colon  . Colon cancer Maternal Grandfather   . Cancer Maternal Grandfather     colon  . Bipolar disorder Daughter     History   Social History  . Marital Status: Married    Spouse Name: Johniya Durfee    Number of Children: 3  . Years of Education: Assoc degr   Occupational History  . human resources     [employer]   Social History Main Topics  . Smoking status: Never Smoker   . Smokeless tobacco: Never Used  . Alcohol Use: 1.5 oz/week    3 drink(s) per week     occasionally  . Drug Use: No  . Sexually Active: Yes -- Female partner(s)   Other Topics Concern  . Not on file   Social History Narrative   Lives with her partner Kiwana Deblasi.     Current Outpatient Prescriptions on File Prior to Visit  Medication Sig Dispense Refill  . fluticasone (FLONASE) 50 MCG/ACT nasal spray 2 sprays by Nasal route daily.  16 g  3  .  etonogestrel-ethinyl estradiol (NUVARING) 0.12-0.015 MG/24HR vaginal ring Place 1 each vaginally every 21 ( twenty-one) days. Insert one (1) ring vaginally and leave in place for three (3) weeks, then remove for one (1) week.  1 each  11    No Known Allergies  Review of Systems  Review of Systems  Constitutional: Negative for fever, chills and malaise/fatigue.  HENT: Negative for hearing loss, nosebleeds and congestion.   Eyes: Negative for discharge.  Respiratory: Negative for cough, sputum production, shortness of breath and wheezing.   Cardiovascular: Negative for chest pain, palpitations and leg swelling.  Gastrointestinal: Positive for heartburn, nausea, abdominal pain, diarrhea and constipation. Negative for vomiting and blood in stool.    Genitourinary: Negative for dysuria, urgency, frequency and hematuria.  Musculoskeletal: Positive for joint pain. Negative for myalgias, back pain and falls.       Right foot pain  Skin: Negative for rash.  Neurological: Positive for headaches. Negative for dizziness, tremors, sensory change, focal weakness, loss of consciousness and weakness.  Endo/Heme/Allergies: Negative for polydipsia. Does not bruise/bleed easily.  Psychiatric/Behavioral: Negative for depression and suicidal ideas. The patient is not nervous/anxious and does not have insomnia.      Objective  BP 117/80  Pulse 85  Temp(Src) 97.9 F (36.6 C) (Oral)  Ht 5\' 3"  (1.6 m)  Wt 208 lb 12.8 oz (94.711 kg)  BMI 36.99 kg/m2  SpO2 97%  LMP 09/08/2011  Physical Exam  Physical Exam  Constitutional: She is oriented to person, place, and time and well-developed, well-nourished, and in no distress. No distress.  HENT:  Head: Normocephalic and atraumatic.  Right Ear: External ear normal.  Left Ear: External ear normal.  Nose: Nose normal.  Mouth/Throat: Oropharynx is clear and moist. No oropharyngeal exudate.  Eyes: Conjunctivae are normal. Pupils are equal, round, and reactive to light. Right eye exhibits no discharge. Left eye exhibits no discharge. No scleral icterus.  Neck: Normal range of motion. Neck supple. No thyromegaly present.  Cardiovascular: Normal rate, regular rhythm, normal heart sounds and intact distal pulses.   No murmur heard. Pulmonary/Chest: Effort normal and breath sounds normal. No respiratory distress. She has no wheezes. She has no rales.  Abdominal: Soft. Bowel sounds are normal. She exhibits no distension and no mass. There is no tenderness.  Musculoskeletal: Normal range of motion. She exhibits no edema and no tenderness.  Lymphadenopathy:    She has no cervical adenopathy.  Neurological: She is alert and oriented to person, place, and time. She has normal reflexes. No cranial nerve deficit.  Coordination normal.  Skin: Skin is warm and dry. No rash noted. She is not diaphoretic.  Psychiatric: Mood, memory and affect normal.       Assessment & Plan  Migraines Requesting a change to the Maxalt tab from the MLT. No trouble with frequent HA, encouraged adequate hydration, sleep and exercise  GERD (gastroesophageal reflux disease) Describing IBS symptoms with alternating diarrhea and constipation, encouraged a probiotic and fiber supplement daily, may use Tams and Maalox prn for reflux symptoms and use Ranitidine prn. Avoid offending foods, report worsening symptoms. Describes some intermittent abdominal cramps, is given some Hyoscyamine to try prn  Allergic rhinitis Patient denies current symptoms  Preventative health care Tdap given today, avoid trans fats, get 7-8 sleep, try DASH diet.  Foot pain, right Pain at base of foot c/w fasciitis, encouraged no foot wear without backs, good shoes with arch support and gel shoe inserts, apply ice bid and Aspercreme, may use ALeve  prn and report worsening symptoms  Ovarian cyst, right No recurrence

## 2011-10-13 NOTE — Assessment & Plan Note (Addendum)
Describing IBS symptoms with alternating diarrhea and constipation, encouraged a probiotic and fiber supplement daily, may use Tams and Maalox prn for reflux symptoms and use Ranitidine prn. Avoid offending foods, report worsening symptoms. Describes some intermittent abdominal cramps, is given some Hyoscyamine to try prn

## 2011-10-13 NOTE — Assessment & Plan Note (Signed)
Patient denies current symptoms. 

## 2011-10-13 NOTE — Assessment & Plan Note (Signed)
No recurrence. 

## 2011-11-05 ENCOUNTER — Ambulatory Visit (INDEPENDENT_AMBULATORY_CARE_PROVIDER_SITE_OTHER): Payer: BC Managed Care – PPO | Admitting: Family Medicine

## 2011-11-05 ENCOUNTER — Encounter: Payer: Self-pay | Admitting: Family Medicine

## 2011-11-05 VITALS — BP 131/85 | HR 89 | Temp 97.8°F | Ht 63.0 in | Wt 211.8 lb

## 2011-11-05 DIAGNOSIS — E669 Obesity, unspecified: Secondary | ICD-10-CM

## 2011-11-05 DIAGNOSIS — J40 Bronchitis, not specified as acute or chronic: Secondary | ICD-10-CM

## 2011-11-05 HISTORY — DX: Bronchitis, not specified as acute or chronic: J40

## 2011-11-05 MED ORDER — HYDROCOD POLST-CHLORPHEN POLST 10-8 MG/5ML PO LQCR: 5.0000 mL | Freq: Every evening | ORAL | Status: DC | PRN

## 2011-11-05 MED ORDER — AZITHROMYCIN 250 MG PO TABS: ORAL_TABLET | ORAL | Status: DC

## 2011-11-05 MED ORDER — AZITHROMYCIN 250 MG PO TABS: ORAL_TABLET | ORAL | Status: AC

## 2011-11-05 MED ORDER — PHENTERMINE HCL 15 MG PO CAPS: 15.0000 mg | ORAL_CAPSULE | ORAL | Status: DC

## 2011-11-05 NOTE — Progress Notes (Signed)
Patient ID: Santo Held, female   DOB: 12-14-1970, 41 y.o.   MRN: [id number] Akasia Ahmad [id number] 08-Feb-1971 11/05/2011      Progress Note-Follow Up  Subjective  Chief Complaint  Chief Complaint  Patient presents with  . Follow-up    1 month follow up    HPI  Patient is a 41 year old Caucasian female who is in today for follow up a new patient appointment. His first treated her for persistent leaking. As he is phentermine in the past and would like to try it again. Denies any recent palpitations, chest pain, shortness of breath. Has been struggling with increased congestion and cough. Fatigue and malaise are also noted. Does think she is finally improving but has been sick for 2 weeks now. No obvious high-grade fevers or chills. She does have some head congestion but most were congested in her chest. Throat, GI or GU complaints noted today.  Past Medical History  Diagnosis Date  . Migraines   . Chicken pox as a child    X 2  . Kidney stones 7-12    X 3  . Diverticulitis 2008  . Colon polyps 2008  . GERD (gastroesophageal reflux disease) 2008  . Erosive inflammation of stomach lining 2008  . Obesity 10/08/2011  . Preventative health care 10/13/2011  . Foot pain, right 10/13/2011  . Ovarian cyst, right   . Bronchitis 11/05/2011    Past Surgical History  Procedure Date  . Lithotripsy 2008  . Tonsilectomy, adenoidectomy, bilateral myringotomy and tubes     Age 41  . Stent for kidney stone 7-12    Family History  Problem Relation Age of Onset  . Skin cancer    . Hypertension Mother   . Cancer Mother     skin, melanoma  . Migraines Mother   . Heart disease Maternal Grandmother   . Cancer Maternal Grandmother     colon  . Colon cancer Maternal Grandfather   . Cancer Maternal Grandfather     colon  . Bipolar disorder Daughter     History   Social History  . Marital Status: Married    Spouse Name: Porshia Blizzard    Number of Children: 3  . Years of Education:  Assoc degr   Occupational History  . human resources     [employer]   Social History Main Topics  . Smoking status: Never Smoker   . Smokeless tobacco: Never Used  . Alcohol Use: 1.5 oz/week    3 drink(s) per week     occasionally  . Drug Use: No  . Sexually Active: Yes -- Female partner(s)   Other Topics Concern  . Not on file   Social History Narrative   Lives with her partner Fatou Dunnigan.     Current Outpatient Prescriptions on File Prior to Visit  Medication Sig Dispense Refill  . fluticasone (FLONASE) 50 MCG/ACT nasal spray 2 sprays by Nasal route daily.  16 g  3  . ranitidine (ZANTAC) 300 MG tablet Take 1 tablet (300 mg total) by mouth at bedtime.  30 tablet  1  . rizatriptan (MAXALT) 10 MG tablet Take 1 tablet (10 mg total) by mouth as needed. May repeat in 2 hours if needed  18 tablet  1  . etonogestrel-ethinyl estradiol (NUVARING) 0.12-0.015 MG/24HR vaginal ring Place 1 each vaginally every 21 ( twenty-one) days. Insert one (1) ring vaginally and leave in place for three (3) weeks, then remove for one (1) week.  1 each  11  No Known Allergies  Review of Systems  Review of Systems  Constitutional: Positive for malaise/fatigue. Negative for fever.       Does acknowledge she does snore.  HENT: Positive for congestion.   Eyes: Negative for discharge.  Respiratory: Positive for cough and wheezing. Negative for shortness of breath.   Cardiovascular: Negative for chest pain, palpitations and leg swelling.  Gastrointestinal: Negative for nausea, abdominal pain and diarrhea.  Genitourinary: Negative for dysuria.  Musculoskeletal: Negative for falls.  Skin: Negative for rash.  Neurological: Negative for loss of consciousness and headaches.  Endo/Heme/Allergies: Negative for polydipsia.  Psychiatric/Behavioral: Negative for depression and suicidal ideas. The patient is not nervous/anxious and does not have insomnia.     Objective  BP 131/85  Pulse 89  Temp(Src) 97.8 F  (36.6 C) (Temporal)  Ht 5\' 3"  (1.6 m)  Wt 211 lb 12.8 oz (96.072 kg)  BMI 37.52 kg/m2  SpO2 96%  LMP 10/13/2011  Physical Exam  Physical Exam  Constitutional: She is oriented to person, place, and time and well-developed, well-nourished, and in no distress. No distress.  HENT:  Head: Normocephalic and atraumatic.  Eyes: Conjunctivae are normal.  Neck: Neck supple. No thyromegaly present.  Cardiovascular: Normal rate, regular rhythm and normal heart sounds.   No murmur heard. Pulmonary/Chest: Effort normal. She has wheezes.       Expiratory wheeze throughout  Abdominal: She exhibits no distension and no mass.  Musculoskeletal: She exhibits no edema.  Lymphadenopathy:    She has no cervical adenopathy.  Neurological: She is alert and oriented to person, place, and time.  Skin: Skin is warm and dry. No rash noted. She is not diaphoretic.  Psychiatric: Memory, affect and judgment normal.    Lab Results  Component Value Date   TSH 2.366 10/08/2011   Lab Results  Component Value Date   WBC 8.2 10/08/2011   HGB 14.2 10/08/2011   HCT 42.4 10/08/2011   MCV 85.8 10/08/2011   PLT 292 10/08/2011   Lab Results  Component Value Date   CREATININE 0.77 10/08/2011   BUN 10 10/08/2011   NA 135 10/08/2011   K 4.4 10/08/2011   CL 103 10/08/2011   CO2 25 10/08/2011   Lab Results  Component Value Date   ALT 11 10/08/2011   AST 17 10/08/2011   ALKPHOS 76 10/08/2011   BILITOT 0.4 10/08/2011   Lab Results  Component Value Date   CHOL 184 10/08/2011   Lab Results  Component Value Date   HDL 49 10/08/2011   Lab Results  Component Value Date   LDLCALC 105* 10/08/2011   Lab Results  Component Value Date   TRIG 152* 10/08/2011   Lab Results  Component Value Date   CHOLHDL 3.8 10/08/2011     Assessment & Plan  Obesity Given a handout on the DASH diet and started on Phentermine 15 mg daily and reassess in 2 weeks or as needed.  Bronchitis Continue mucinex, add antibiotics and cough  suppressant, increase fluids and rest and report if no improvement

## 2011-11-05 NOTE — Assessment & Plan Note (Signed)
Continue mucinex, add antibiotics and cough suppressant, increase fluids and rest and report if no improvement

## 2011-11-05 NOTE — Patient Instructions (Signed)

## 2011-11-05 NOTE — Assessment & Plan Note (Signed)
Given a handout on the DASH diet and started on Phentermine 15 mg daily and reassess in 2 weeks or as needed.

## 2011-11-13 ENCOUNTER — Encounter: Payer: Self-pay | Admitting: Gastroenterology

## 2011-11-19 ENCOUNTER — Ambulatory Visit (INDEPENDENT_AMBULATORY_CARE_PROVIDER_SITE_OTHER): Payer: BC Managed Care – PPO | Admitting: Family Medicine

## 2011-11-19 ENCOUNTER — Encounter: Payer: Self-pay | Admitting: Family Medicine

## 2011-11-19 VITALS — BP 123/81 | HR 76 | Temp 97.8°F | Ht 63.0 in | Wt 202.8 lb

## 2011-11-19 DIAGNOSIS — M79609 Pain in unspecified limb: Secondary | ICD-10-CM

## 2011-11-19 DIAGNOSIS — K219 Gastro-esophageal reflux disease without esophagitis: Secondary | ICD-10-CM

## 2011-11-19 DIAGNOSIS — J40 Bronchitis, not specified as acute or chronic: Secondary | ICD-10-CM

## 2011-11-19 DIAGNOSIS — M79671 Pain in right foot: Secondary | ICD-10-CM

## 2011-11-19 DIAGNOSIS — E669 Obesity, unspecified: Secondary | ICD-10-CM

## 2011-11-19 MED ORDER — PHENTERMINE HCL 37.5 MG PO CAPS: 37.5000 mg | ORAL_CAPSULE | ORAL | Status: DC

## 2011-11-19 NOTE — Assessment & Plan Note (Signed)
Reflux essentially resolved with dietary changes and weight loss, is not having to take Ranitidine any longer.

## 2011-11-19 NOTE — Assessment & Plan Note (Signed)
Patient with lingering cough, but other wise improved. Increase fluids, restart Mucinex and call if symptoms worsen

## 2011-11-19 NOTE — Progress Notes (Signed)
Patient ID: Lisa Ray, female   DOB: [birth year], 41 y.o.   MRN: [id number] Kyana Aicher [id number] 1971/09/09 11/19/2011      Progress Note-Follow Up  Subjective  Chief Complaint  Chief Complaint  Patient presents with  . Follow-up    2 week follow up on medication    HPI  Patient is a 41 year old female in today for followup nutrition appointment. She reports feeling much better since her last visit. She has made significant dietary changes and has lost weight. She also notes her reflux is essentially resolved she is no longer needing ranitidine. Her right foot pain is improved with stretching and topical treatments and she is pleased. No recent illness fevers chills. Her cough and shortness of breath greatly improved with azithromycin but she is still having some low-grade symptoms. No fevers, chills, headache palpitations, shortness of breath, GI or GU concerns or  Past Medical History  Diagnosis Date  . Migraines   . Chicken pox as a child    X 2  . Kidney stones 7-12    X 3  . Diverticulitis 2008  . Colon polyps 2008  . GERD (gastroesophageal reflux disease) 2008  . Erosive inflammation of stomach lining 2008  . Obesity 10/08/2011  . Preventative health care 10/13/2011  . Foot pain, right 10/13/2011  . Ovarian cyst, right   . Bronchitis 11/05/2011    Past Surgical History  Procedure Date  . Lithotripsy 2008  . Tonsilectomy, adenoidectomy, bilateral myringotomy and tubes     Age 83  . Stent for kidney stone 7-12    Family History  Problem Relation Age of Onset  . Skin cancer    . Hypertension Mother   . Cancer Mother     skin, melanoma  . Migraines Mother   . Heart disease Maternal Grandmother   . Cancer Maternal Grandmother     colon  . Colon cancer Maternal Grandfather   . Cancer Maternal Grandfather     colon  . Bipolar disorder Daughter     History   Social History  . Marital Status: Married    Spouse Name: Lyra Alaimo    Number of Children:  3  . Years of Education: Assoc degr   Occupational History  . human resources     [employer]   Social History Main Topics  . Smoking status: Never Smoker   . Smokeless tobacco: Never Used  . Alcohol Use: 1.5 oz/week    3 drink(s) per week     occasionally  . Drug Use: No  . Sexually Active: Yes -- Female partner(s)   Other Topics Concern  . Not on file   Social History Narrative   Lives with her partner Jaleigh Mccroskey.     Current Outpatient Prescriptions on File Prior to Visit  Medication Sig Dispense Refill  . etonogestrel-ethinyl estradiol (NUVARING) 0.12-0.015 MG/24HR vaginal ring Place 1 each vaginally every 21 ( twenty-one) days. Insert one (1) ring vaginally and leave in place for three (3) weeks, then remove for one (1) week.  1 each  11  . naproxen sodium (ANAPROX) 220 MG tablet Take 220 mg by mouth as needed.        . ranitidine (ZANTAC) 300 MG tablet Take 1 tablet (300 mg total) by mouth at bedtime.  30 tablet  1  . rizatriptan (MAXALT) 10 MG tablet Take 1 tablet (10 mg total) by mouth as needed. May repeat in 2 hours if needed  18 tablet  1  .  chlorpheniramine-HYDROcodone (TUSSIONEX PENNKINETIC ER) 10-8 MG/5ML LQCR Take 5 mLs by mouth at bedtime as needed.  140 mL  0  . fluticasone (FLONASE) 50 MCG/ACT nasal spray 2 sprays by Nasal route daily.  16 g  3    No Known Allergies  Review of Systems  Review of Systems  Constitutional: Negative for fever and malaise/fatigue.  HENT: Negative for congestion.   Eyes: Negative for discharge.  Respiratory: Negative for shortness of breath.   Cardiovascular: Negative for chest pain, palpitations and leg swelling.  Gastrointestinal: Negative for nausea, abdominal pain and diarrhea.  Genitourinary: Negative for dysuria.  Musculoskeletal: Negative for falls.  Skin: Negative for rash.  Neurological: Negative for loss of consciousness and headaches.  Endo/Heme/Allergies: Negative for polydipsia.  Psychiatric/Behavioral: Negative for  depression and suicidal ideas. The patient is not nervous/anxious and does not have insomnia.     Objective  BP 123/81  Pulse 76  Temp(Src) 97.8 F (36.6 C) (Temporal)  Ht 5\' 3"  (1.6 m)  Wt 202 lb 12.8 oz (91.989 kg)  BMI 35.92 kg/m2  SpO2 97%  LMP 11/18/2011  Physical Exam  Physical Exam  Constitutional: She is oriented to person, place, and time and well-developed, well-nourished, and in no distress. No distress.  HENT:  Head: Normocephalic and atraumatic.  Eyes: Conjunctivae are normal.  Neck: Neck supple. No thyromegaly present.  Cardiovascular: Normal rate, regular rhythm and normal heart sounds.   No murmur heard. Pulmonary/Chest: Effort normal and breath sounds normal. She has no wheezes.  Abdominal: She exhibits no distension and no mass.  Musculoskeletal: She exhibits no edema.  Lymphadenopathy:    She has no cervical adenopathy.  Neurological: She is alert and oriented to person, place, and time.  Skin: Skin is warm and dry. No rash noted. She is not diaphoretic.  Psychiatric: Memory, affect and judgment normal.    Lab Results  Component Value Date   TSH 2.366 10/08/2011   Lab Results  Component Value Date   WBC 8.2 10/08/2011   HGB 14.2 10/08/2011   HCT 42.4 10/08/2011   MCV 85.8 10/08/2011   PLT 292 10/08/2011   Lab Results  Component Value Date   CREATININE 0.77 10/08/2011   BUN 10 10/08/2011   NA 135 10/08/2011   K 4.4 10/08/2011   CL 103 10/08/2011   CO2 25 10/08/2011   Lab Results  Component Value Date   ALT 11 10/08/2011   AST 17 10/08/2011   ALKPHOS 76 10/08/2011   BILITOT 0.4 10/08/2011   Lab Results  Component Value Date   CHOL 184 10/08/2011   Lab Results  Component Value Date   HDL 49 10/08/2011   Lab Results  Component Value Date   LDLCALC 105* 10/08/2011   Lab Results  Component Value Date   TRIG 152* 10/08/2011   Lab Results  Component Value Date   CHOLHDL 3.8 10/08/2011     Assessment & Plan  Obesity Good weight loss with  low dose Phentermine, will increase dosing to 37.5 mg daily and reassess in 1mn  GERD (gastroesophageal reflux disease) Reflux essentially resolved with dietary changes and weight loss, is not having to take Ranitidine any longer.  Foot pain, right Nearly resolved with stretches,  Icing and treatments provided at last visit  Bronchitis Patient with lingering cough, but other wise improved. Increase fluids, restart Mucinex and call if symptoms worsen

## 2011-11-19 NOTE — Patient Instructions (Signed)

## 2011-11-19 NOTE — Assessment & Plan Note (Signed)
Nearly resolved with stretches,  Icing and treatments provided at last visit

## 2011-11-19 NOTE — Assessment & Plan Note (Addendum)
Good weight loss with low dose Phentermine, will increase dosing to 37.5 mg daily and reassess in 1mn

## 2011-12-03 ENCOUNTER — Encounter: Payer: Self-pay | Admitting: Gastroenterology

## 2011-12-03 ENCOUNTER — Ambulatory Visit (INDEPENDENT_AMBULATORY_CARE_PROVIDER_SITE_OTHER): Payer: BC Managed Care – PPO | Admitting: Gastroenterology

## 2011-12-03 ENCOUNTER — Other Ambulatory Visit: Payer: BC Managed Care – PPO

## 2011-12-03 VITALS — BP 110/70 | HR 62 | Ht 62.0 in | Wt 201.0 lb

## 2011-12-03 DIAGNOSIS — R109 Unspecified abdominal pain: Secondary | ICD-10-CM

## 2011-12-03 DIAGNOSIS — R197 Diarrhea, unspecified: Secondary | ICD-10-CM

## 2011-12-03 DIAGNOSIS — K219 Gastro-esophageal reflux disease without esophagitis: Secondary | ICD-10-CM

## 2011-12-03 NOTE — Patient Instructions (Addendum)
Please go to the basement today for your labs, Celiac Panel.  Complete the Hemoccult cards given to you in the office today.  Anti reflux information given today.  Follow up with Dr Russella Dar as needed.  Cc Dr. Abner Greenspan

## 2011-12-03 NOTE — Progress Notes (Signed)
History of Present Illness: This is a 41 year old female who relates a several year history of lower abdominal pain associated with urgent, watery, nonbloody diarrhea. Her symptoms gradually worsened over the past few months. She was evaluated by Dr. Abner Greenspan in conjunction with a weight loss program. Her abdominal pain has resolved and she is now having 2 formed bowel movements each day. She states she underwent upper endoscopy and colonoscopy in 2008 [place] showing colon polyps erosive gastritis and erosive esophagitis. Reflux had been previously well controlled on Zantac 300 mg daily but her recent dietary changes she is found to symptoms have resolved not been taking ranitidine. Denies weight loss, constipation, change in stool caliber, melena, hematochezia, nausea, vomiting, dysphagia, chest pain.  Review of Systems: Pertinent positive and negative review of systems were noted in the above HPI section. All other review of systems were otherwise negative.  Current Medications, Allergies, Past Medical History, Past Surgical History, Family History and Social History were reviewed in [employer] record.  Physical Exam: General: Well developed , well nourished, no acute distress Head: Normocephalic and atraumatic Eyes:  sclerae anicteric, EOMI Ears: Normal auditory acuity Mouth: No deformity or lesions Neck: Supple, no masses or thyromegaly Lungs: Clear throughout to auscultation Heart: Regular rate and rhythm; no murmurs, rubs or bruits Abdomen: Soft, non tender and non distended. No masses, hepatosplenomegaly or hernias noted. Normal Bowel sounds Musculoskeletal: Symmetrical with no gross deformities  Skin: No lesions on visible extremities Pulses:  Normal pulses noted Extremities: No clubbing, cyanosis, edema or deformities noted Neurological: Alert oriented x 4, grossly nonfocal Cervical Nodes:  No significant cervical adenopathy Inguinal Nodes: No significant  inguinal adenopathy Psychological:  Alert and cooperative. Normal mood and affect  Assessment and Recommendations:  1. Diarrhea and lower abdominal pain. I suspect this is food intolerances as her symptoms have essentially resolved with diet changes. Rule out celiac disease. Obtain a celiac panel and stool Hemoccults. Will attempt to obtain records from her prior endoscopy and colonoscopy. We'll determine followup interval for colonoscopy when her records are received.  2. History of GERD with erosive esophagitis and erosive gastritis. Continue all standard antireflux measures and resume ranitidine 300 mg daily or twice a day if symptoms return.

## 2011-12-04 LAB — CELIAC PANEL 10
Endomysial Screen: NEGATIVE
IgA: 287 mg/dL (ref 69–380)
Tissue Transglutaminase Ab, IgA: 5.2 U/mL (ref <20)

## 2011-12-10 ENCOUNTER — Encounter: Payer: Self-pay | Admitting: Family Medicine

## 2011-12-10 ENCOUNTER — Ambulatory Visit (INDEPENDENT_AMBULATORY_CARE_PROVIDER_SITE_OTHER): Payer: BC Managed Care – PPO | Admitting: Family Medicine

## 2011-12-10 DIAGNOSIS — J40 Bronchitis, not specified as acute or chronic: Secondary | ICD-10-CM

## 2011-12-10 DIAGNOSIS — E669 Obesity, unspecified: Secondary | ICD-10-CM

## 2011-12-10 DIAGNOSIS — K219 Gastro-esophageal reflux disease without esophagitis: Secondary | ICD-10-CM

## 2011-12-10 MED ORDER — HYDROCOD POLST-CHLORPHEN POLST 10-8 MG/5ML PO LQCR: 5.0000 mL | Freq: Two times a day (BID) | ORAL | Status: DC | PRN

## 2011-12-10 MED ORDER — PHENTERMINE HCL 37.5 MG PO CAPS: 37.5000 mg | ORAL_CAPSULE | ORAL | Status: DC

## 2011-12-10 NOTE — Patient Instructions (Signed)
Tachycardia, Nonspecific In adults, the heart normally beats between 60 and 100 times a minute. A heart rate over 100 is called tachycardia. When your heart beats too fast, it may not be able to pump enough blood to the rest of the body. CAUSES   Exercise or exertion.   Fever.   Pain or injury.   Infection.   Loss of fluid (dehydration).   Overactive thyroid.   Lack of red blood cells (anemia).   Anxiety.   Alcohol.   Heart arrhythmia.   Caffeine.   Tobacco products.   Diet pills.   Street drugs.   Heart disease.  SYMPTOMS  Palpitations (rapid or irregular heartbeat).   Dizziness.   Tiredness (fatigue).   Shortness of breath.  DIAGNOSIS  After an exam and taking a history, your caregiver may order:  Blood tests.   Electrocardiogram (EKG).   Heart monitor.  TREATMENT  Treatment will depend on the cause and potential for harm. It may include:  Intravenous (IV) replacement of fluids or blood.   Antidote or reversal medicines.   Changes in your present medicines.   Lifestyle changes.  HOME CARE INSTRUCTIONS   Get rest.   Drink enough water and fluids to keep your urine clear or pale yellow.   Avoid:   Caffeine.   Nicotine.   Alcohol.   Stress.   Chocolate.   Stimulants.   Only take medicine as directed by your caregiver.  SEEK IMMEDIATE MEDICAL CARE IF:   You have pain in your chest, upper arms, jaw, or neck.   You become weak, dizzy, or feel faint.   You have palpitations that will not go away.   You throw up (vomit), have diarrhea, or pass blood.   You look pale and your skin is cool and wet.  MAKE SURE YOU:   Understand these instructions.   Will watch your condition.   Will get help right away if you are not doing well or get worse.  Document Released: 11/27/2004 Document Revised: 07/02/2011 Document Reviewed: 10/20/2005 [employer] Patient Information 2012 [employer]. 

## 2011-12-10 NOTE — Assessment & Plan Note (Signed)
Has had good appetite suppression and would like to continue her Phentermine for now, she is given refills and taught how to check her pulse if she feels it is running hi, she is to call us for guidance if it is elevated above 100 consistently

## 2011-12-10 NOTE — Assessment & Plan Note (Signed)
Improved but not fully has actually taken 2 courses of antibiotics since she saw [place] last. She had to have a tooth pulled and her dentist put her on 10 days of PCN which she has just completed. She still feels congested but when she's able to get any mucus is clear. She's had no fevers chills. She is encouraged to start Mucinex twice a day and she is given a refill on Tussionex to use each bedtime, increase fluids and report if symptoms worsen to

## 2011-12-10 NOTE — Progress Notes (Signed)
Patient ID: Lisa Ray, female   DOB: 19-Jun-1971, 41 y.o.   MRN: [id number] Sheril Hammond [id number] 1971-06-08 12/10/2011      Progress Note-Follow Up  Subjective  Chief Complaint  Chief Complaint  Patient presents with  . Follow-up    3 week follow up  . Cough    w/ phlegm (clear)    HPI  Patient is a 41 year old caucasian female in today for follow up on her weight. She has been taking the phentermine routinely and doesn't believe it has helped her appetite suppression. She has lost weight and is feeling pleased with this. She denies any sense of palpitations, chest pain shortness of breath. She has some increased anxiety but acknowledges largely this is due to his temper with her daughters. Her 48 year old daughter is become more labile and irritable, is starting to have some angry outbursts. Her older daughter has Re: been diagnosed with bipolar disorder she finds herself very anxious about her younger daughter's future. She used to struggle with stress at work as well. She had her GI workup and is pleased with the results. Happy that she doesn't have celiac disease and presently her stomach is somewhat better. She had a tooth pulled and she was last seen and had a second course of antibiotics as a result. She denies any fevers or chills but her cough is present still. Lots of clear phlegm. No headache or ear pain, chest pain, palpitations or shortness of breath. Off continues to keep her up at night.  Past Medical History  Diagnosis Date  . Migraines   . Chicken pox as a child    X 2  . Kidney stones 7-12    X 3  . Diverticulosis 2008  . Colon polyps 2008  . GERD (gastroesophageal reflux disease) 2008  . Erosive inflammation of stomach lining 2008  . Obesity 10/08/2011  . Preventative health care 10/13/2011  . Foot pain, right 10/13/2011  . Ovarian cyst, right   . Bronchitis 11/05/2011    Past Surgical History  Procedure Date  . Lithotripsy 2008  . Tonsilectomy,  adenoidectomy, bilateral myringotomy and tubes     Age 41  . Stent for kidney stone 7-12  . Ovarian cyst removal     Family History  Problem Relation Age of Onset  . Hypertension Mother   . Cancer Mother     skin, melanoma  . Migraines Mother   . Heart disease Maternal Grandmother   . Colon cancer Maternal Grandmother   . Colon cancer Maternal Grandfather   . Colon cancer Maternal Grandfather   . Bipolar disorder Daughter     History   Social History  . Marital Status: Married    Spouse Name: Sylena Lotter    Number of Children: 3  . Years of Education: Assoc degr   Occupational History  . human resources     [employer]    Social History Main Topics  . Smoking status: Never Smoker   . Smokeless tobacco: Never Used  . Alcohol Use: 1.5 oz/week    3 drink(s) per week     occasionally  . Drug Use: No  . Sexually Active: Yes -- Female partner(s)   Other Topics Concern  . Not on file   Social History Narrative   Lives with her partner Yeraldi Fidler.     Current Outpatient Prescriptions on File Prior to Visit  Medication Sig Dispense Refill  . naproxen sodium (ANAPROX) 220 MG tablet Take 220 mg by  mouth as needed.        . rizatriptan (MAXALT) 10 MG tablet Take 1 tablet (10 mg total) by mouth as needed. May repeat in 2 hours if needed  18 tablet  1  . fluticasone (FLONASE) 50 MCG/ACT nasal spray 2 sprays by Nasal route daily.  16 g  3    No Known Allergies  Review of Systems  Review of Systems  Constitutional: Negative for fever and malaise/fatigue.  HENT: Positive for congestion.   Eyes: Negative for discharge.  Respiratory: Positive for cough. Negative for shortness of breath.   Cardiovascular: Negative for chest pain, palpitations and leg swelling.  Gastrointestinal: Negative for nausea, abdominal pain and diarrhea.  Genitourinary: Negative for dysuria.  Musculoskeletal: Negative for falls.  Skin: Negative for rash.  Neurological: Negative for loss of  consciousness and headaches.  Endo/Heme/Allergies: Negative for polydipsia.  Psychiatric/Behavioral: Negative for depression and suicidal ideas. The patient is nervous/anxious. The patient does not have insomnia.     Objective  BP 121/84  Pulse 78  Temp(Src) 99.8 F (37.7 C) (Temporal)  Ht 5\' 3"  (1.6 m)  Wt 199 lb 1.9 oz (90.32 kg)  BMI 35.27 kg/m2  SpO2 96%  LMP 12/10/2011  Physical Exam  Physical Exam  Constitutional: She is oriented to person, place, and time and well-developed, well-nourished, and in no distress. No distress.  HENT:  Head: Normocephalic and atraumatic.  Eyes: Conjunctivae are normal.  Neck: Neck supple. No thyromegaly present.  Cardiovascular: Normal rate, regular rhythm and normal heart sounds.   No murmur heard. Pulmonary/Chest: Effort normal and breath sounds normal. She has no wheezes.  Abdominal: She exhibits no distension and no mass.  Musculoskeletal: She exhibits no edema.  Lymphadenopathy:    She has no cervical adenopathy.  Neurological: She is alert and oriented to person, place, and time.  Skin: Skin is warm and dry. No rash noted. She is not diaphoretic.  Psychiatric: Memory, affect and judgment normal.    Lab Results  Component Value Date   TSH 2.366 10/08/2011   Lab Results  Component Value Date   WBC 8.2 10/08/2011   HGB 14.2 10/08/2011   HCT 42.4 10/08/2011   MCV 85.8 10/08/2011   PLT 292 10/08/2011   Lab Results  Component Value Date   CREATININE 0.77 10/08/2011   BUN 10 10/08/2011   NA 135 10/08/2011   K 4.4 10/08/2011   CL 103 10/08/2011   CO2 25 10/08/2011   Lab Results  Component Value Date   ALT 11 10/08/2011   AST 17 10/08/2011   ALKPHOS 76 10/08/2011   BILITOT 0.4 10/08/2011   Lab Results  Component Value Date   CHOL 184 10/08/2011   Lab Results  Component Value Date   HDL 49 10/08/2011   Lab Results  Component Value Date   LDLCALC 105* 10/08/2011   Lab Results  Component Value Date   TRIG 152* 10/08/2011   Lab  Results  Component Value Date   CHOLHDL 3.8 10/08/2011     Assessment & Plan  Bronchitis Improved but not fully has actually taken 2 courses of antibiotics since she saw [place] last. She had to have a tooth pulled and her dentist put her on 10 days of PCN which she has just completed. She still feels congested but when she's able to get any mucus is clear. She's had no fevers chills. She is encouraged to start Mucinex twice a day and she is given a refill on Tussionex to  use each bedtime, increase fluids and report if symptoms worsen to  GERD (gastroesophageal reflux disease) Improved and has completed a GI work up and all of her testing came back negative. She was very pleased with her care and reassured  Obesity Has had good appetite suppression and would like to continue her Phentermine for now, she is given refills and taught how to check her pulse if she feels it is running hi, she is to call us for guidance if it is elevated above 100 consistently

## 2011-12-10 NOTE — Assessment & Plan Note (Signed)
Improved and has completed a GI work up and all of her testing came back negative. She was very pleased with her care and reassured

## 2011-12-31 ENCOUNTER — Ambulatory Visit (INDEPENDENT_AMBULATORY_CARE_PROVIDER_SITE_OTHER): Payer: BC Managed Care – PPO | Admitting: Family Medicine

## 2011-12-31 ENCOUNTER — Encounter: Payer: Self-pay | Admitting: Family Medicine

## 2011-12-31 VITALS — BP 134/82 | HR 91 | Temp 98.9°F | Ht 63.0 in | Wt 193.8 lb

## 2011-12-31 DIAGNOSIS — M549 Dorsalgia, unspecified: Secondary | ICD-10-CM

## 2011-12-31 DIAGNOSIS — M25519 Pain in unspecified shoulder: Secondary | ICD-10-CM

## 2011-12-31 DIAGNOSIS — M25512 Pain in left shoulder: Secondary | ICD-10-CM

## 2011-12-31 MED ORDER — NAPROXEN 500 MG PO TABS: 500.0000 mg | ORAL_TABLET | Freq: Two times a day (BID) | ORAL | Status: DC

## 2011-12-31 MED ORDER — HYDROCODONE-ACETAMINOPHEN 5-325 MG PO TABS: 1.0000 | ORAL_TABLET | Freq: Three times a day (TID) | ORAL | Status: AC | PRN

## 2011-12-31 MED ORDER — CYCLOBENZAPRINE HCL 10 MG PO TABS: 10.0000 mg | ORAL_TABLET | Freq: Two times a day (BID) | ORAL | Status: AC | PRN

## 2011-12-31 NOTE — Patient Instructions (Signed)
Back Pain, Adult Low back pain is very common. About 1 in 5 people have back pain.The cause of low back pain is rarely dangerous. The pain often gets better over time.About half of people with a sudden onset of back pain feel better in just 2 weeks. About 8 in 10 people feel better by 6 weeks.  CAUSES Some common causes of back pain include:  Strain of the muscles or ligaments supporting the spine.   Wear and tear (degeneration) of the spinal discs.   Arthritis.   Direct injury to the back.  DIAGNOSIS Most of the time, the direct cause of low back pain is not known.However, back pain can be treated effectively even when the exact cause of the pain is unknown.Answering your caregiver's questions about your overall health and symptoms is one of the most accurate ways to make sure the cause of your pain is not dangerous. If your caregiver needs more information, he or she may order lab work or imaging tests (X-rays or MRIs).However, even if imaging tests show changes in your back, this usually does not require surgery. HOME CARE INSTRUCTIONS For many people, back pain returns.Since low back pain is rarely dangerous, it is often a condition that people can learn to manageon their own.   Remain active. It is stressful on the back to sit or stand in one place. Do not sit, drive, or stand in one place for more than 30 minutes at a time. Take short walks on level surfaces as soon as pain allows.Try to increase the length of time you walk each day.   Do not stay in bed.Resting more than 1 or 2 days can delay your recovery.   Do not avoid exercise or work.Your body is made to move.It is not dangerous to be active, even though your back may hurt.Your back will likely heal faster if you return to being active before your pain is gone.   Pay attention to your body when you bend and lift. Many people have less discomfortwhen lifting if they bend their knees, keep the load close to their  bodies,and avoid twisting. Often, the most comfortable positions are those that put less stress on your recovering back.   Find a comfortable position to sleep. Use a firm mattress and lie on your side with your knees slightly bent. If you lie on your back, put a pillow under your knees.   Only take over-the-counter or prescription medicines as directed by your caregiver. Over-the-counter medicines to reduce pain and inflammation are often the most helpful.Your caregiver may prescribe muscle relaxant drugs.These medicines help dull your pain so you can more quickly return to your normal activities and healthy exercise.   Put ice on the injured area.   Put ice in a plastic bag.   Place a towel between your skin and the bag.   Leave the ice on for 15 to 20 minutes, 3 to 4 times a day for the first 2 to 3 days. After that, ice and heat may be alternated to reduce pain and spasms.   Ask your caregiver about trying back exercises and gentle massage. This may be of some benefit.   Avoid feeling anxious or stressed.Stress increases muscle tension and can worsen back pain.It is important to recognize when you are anxious or stressed and learn ways to manage it.Exercise is a great option.  SEEK MEDICAL CARE IF:  You have pain that is not relieved with rest or medicine.   You have   pain that does not improve in 1 week.   You have new symptoms.   You are generally not feeling well.  SEEK IMMEDIATE MEDICAL CARE IF:   You have pain that radiates from your back into your legs.   You develop new bowel or bladder control problems.   You have unusual weakness or numbness in your arms or legs.   You develop nausea or vomiting.   You develop abdominal pain.   You feel faint.  Document Released: 10/20/2005 Document Revised: 07/02/2011 Document Reviewed: 03/10/2011 [facility] Patient Information 2012 [place] [place], [place].  Moist heat and gentle stretching twice daily for a week and then 1 x  daily

## 2012-01-04 ENCOUNTER — Encounter: Payer: Self-pay | Admitting: Family Medicine

## 2012-01-04 DIAGNOSIS — M25512 Pain in left shoulder: Secondary | ICD-10-CM

## 2012-01-04 HISTORY — DX: Pain in left shoulder: M25.512

## 2012-01-04 NOTE — Assessment & Plan Note (Addendum)
Presents about one week. Unclear etiology. Denies any injury. Pain worse with movement. Likely bursitis or strain. Rest, naproxen and Flexeril are prescribed. She will notify us if symptoms persist and may need physical therapy or referral to orthopedics if symptoms worsen or do not resolve.

## 2012-01-04 NOTE — Progress Notes (Signed)
Patient ID: Lisa Ray, female   DOB: 1971-01-07, 41 y.o.   MRN: [id number] Lisa Ray [id number] 09-Apr-1971 01/04/2012      Progress Note-Follow Up  Subjective  Chief Complaint  Chief Complaint  Patient presents with  . Shoulder Injury    can't lift arm - left X 1 week (unsure what happened)    HPI  Week history of left shoulder pain. She denies any fall or acute injury. No radicular symptoms. Pain is worse with extreme movements and lifting. No neurologic complaints. No history of similar injury. No other complaints such as neck pain, fevers, chills, chest pain, palpitations or shortness of breath.  Past Medical History  Diagnosis Date  . Migraines   . Chicken pox as a child    X 2  . Kidney stones 7-12    X 3  . Diverticulosis 2008  . Colon polyps 2008  . GERD (gastroesophageal reflux disease) 2008  . Erosive inflammation of stomach lining 2008  . Obesity 10/08/2011  . Preventative health care 10/13/2011  . Foot pain, right 10/13/2011  . Ovarian cyst, right   . Bronchitis 11/05/2011  . Left shoulder pain 01/04/2012    Past Surgical History  Procedure Date  . Lithotripsy 2008  . Tonsilectomy, adenoidectomy, bilateral myringotomy and tubes     Age 89  . Stent for kidney stone 7-12  . Ovarian cyst removal     Family History  Problem Relation Age of Onset  . Hypertension Mother   . Cancer Mother     skin, melanoma  . Migraines Mother   . Heart disease Maternal Grandmother   . Colon cancer Maternal Grandmother   . Colon cancer Maternal Grandfather   . Colon cancer Maternal Grandfather   . Bipolar disorder Daughter     History   Social History  . Marital Status: Married    Spouse Name: Desere Gwin    Number of Children: 3  . Years of Education: Assoc degr   Occupational History  . human resources     [employer]    Social History Main Topics  . Smoking status: Never Smoker   . Smokeless tobacco: Never Used  . Alcohol Use: 1.5 oz/week    3 drink(s) per  week     occasionally  . Drug Use: No  . Sexually Active: Yes -- Female partner(s)   Other Topics Concern  . Not on file   Social History Narrative   Lives with her partner Aloise Copus.     Current Outpatient Prescriptions on File Prior to Visit  Medication Sig Dispense Refill  . naproxen sodium (ANAPROX) 220 MG tablet Take 220 mg by mouth as needed.        . phentermine 37.5 MG capsule Take 1 capsule (37.5 mg total) by mouth every morning.  30 capsule  2  . chlorpheniramine-HYDROcodone (TUSSIONEX) 10-8 MG/5ML LQCR Take 5 mLs by mouth every 12 (twelve) hours as needed.  140 mL  1  . fluticasone (FLONASE) 50 MCG/ACT nasal spray 2 sprays by Nasal route daily.  16 g  3  . rizatriptan (MAXALT) 10 MG tablet Take 1 tablet (10 mg total) by mouth as needed. May repeat in 2 hours if needed  18 tablet  1    No Known Allergies  Review of Systems  Review of Systems  Constitutional: Negative for fever and malaise/fatigue.  HENT: Negative for congestion and neck pain.   Eyes: Negative for discharge.  Respiratory: Negative for shortness of breath.  Cardiovascular: Negative for chest pain, palpitations and leg swelling.  Gastrointestinal: Negative for nausea, abdominal pain and diarrhea.  Genitourinary: Negative for dysuria.  Musculoskeletal: Positive for joint pain. Negative for myalgias, back pain and falls.       Left shoulder pain. Roughly 1 week. No radiculopathy  Skin: Negative for rash.  Neurological: Negative for loss of consciousness and headaches.  Endo/Heme/Allergies: Negative for polydipsia.  Psychiatric/Behavioral: Negative for depression and suicidal ideas. The patient is not nervous/anxious and does not have insomnia.     Objective  BP 134/82  Pulse 91  Temp(Src) 98.9 F (37.2 C) (Temporal)  Ht 5\' 3"  (1.6 m)  Wt 193 lb 12.8 oz (87.907 kg)  BMI 34.33 kg/m2  SpO2 97%  LMP 12/10/2011  Physical Exam  Physical Exam  Constitutional: She is oriented to person, place,  and time and well-developed, well-nourished, and in no distress. No distress.  HENT:  Head: Normocephalic and atraumatic.  Eyes: Conjunctivae are normal.  Neck: Neck supple. No thyromegaly present.  Cardiovascular: Normal rate, regular rhythm and normal heart sounds.   No murmur heard. Pulmonary/Chest: Effort normal and breath sounds normal. She has no wheezes.  Abdominal: She exhibits no distension and no mass.  Musculoskeletal: She exhibits tenderness. She exhibits no edema.       Slight pain with palp over lateral left shoulder.   Lymphadenopathy:    She has no cervical adenopathy.  Neurological: She is alert and oriented to person, place, and time.  Skin: Skin is warm and dry. No rash noted. She is not diaphoretic.  Psychiatric: Memory, affect and judgment normal.    Lab Results  Component Value Date   TSH 2.366 10/08/2011   Lab Results  Component Value Date   WBC 8.2 10/08/2011   HGB 14.2 10/08/2011   HCT 42.4 10/08/2011   MCV 85.8 10/08/2011   PLT 292 10/08/2011   Lab Results  Component Value Date   CREATININE 0.77 10/08/2011   BUN 10 10/08/2011   NA 135 10/08/2011   K 4.4 10/08/2011   CL 103 10/08/2011   CO2 25 10/08/2011   Lab Results  Component Value Date   ALT 11 10/08/2011   AST 17 10/08/2011   ALKPHOS 76 10/08/2011   BILITOT 0.4 10/08/2011   Lab Results  Component Value Date   CHOL 184 10/08/2011   Lab Results  Component Value Date   HDL 49 10/08/2011   Lab Results  Component Value Date   LDLCALC 105* 10/08/2011   Lab Results  Component Value Date   TRIG 152* 10/08/2011   Lab Results  Component Value Date   CHOLHDL 3.8 10/08/2011     Assessment & Plan  Left shoulder pain Presents about one week. Unclear etiology. Denies any injury. Pain worse with movement. Likely bursitis or strain. Rest, naproxen and Flexeril are prescribed. She will notify us if symptoms persist and may need physical therapy or referral to orthopedics if symptoms worsen or do not  resolve.

## 2012-05-31 ENCOUNTER — Ambulatory Visit (INDEPENDENT_AMBULATORY_CARE_PROVIDER_SITE_OTHER): Payer: BC Managed Care – PPO | Admitting: Family Medicine

## 2012-05-31 ENCOUNTER — Ambulatory Visit (INDEPENDENT_AMBULATORY_CARE_PROVIDER_SITE_OTHER)
Admission: RE | Admit: 2012-05-31 | Discharge: 2012-05-31 | Disposition: A | Discharge status: Home / Self Care | Payer: BC Managed Care – PPO | Source: Ambulatory Visit | Attending: Family Medicine | Admitting: Family Medicine

## 2012-05-31 ENCOUNTER — Encounter: Payer: Self-pay | Admitting: Family Medicine

## 2012-05-31 VITALS — BP 113/79 | HR 92 | Temp 98.4°F | Resp 16 | Ht 62.0 in | Wt 180.8 lb

## 2012-05-31 DIAGNOSIS — L909 Atrophic disorder of skin, unspecified: Secondary | ICD-10-CM

## 2012-05-31 DIAGNOSIS — K219 Gastro-esophageal reflux disease without esophagitis: Secondary | ICD-10-CM

## 2012-05-31 DIAGNOSIS — R059 Cough, unspecified: Secondary | ICD-10-CM

## 2012-05-31 DIAGNOSIS — E669 Obesity, unspecified: Secondary | ICD-10-CM

## 2012-05-31 DIAGNOSIS — R05 Cough: Secondary | ICD-10-CM

## 2012-05-31 DIAGNOSIS — IMO0001 Reserved for inherently not codable concepts without codable children: Secondary | ICD-10-CM

## 2012-05-31 DIAGNOSIS — L919 Hypertrophic disorder of the skin, unspecified: Secondary | ICD-10-CM

## 2012-05-31 DIAGNOSIS — L918 Other hypertrophic disorders of the skin: Secondary | ICD-10-CM

## 2012-05-31 MED ORDER — PHENTERMINE HCL 37.5 MG PO CAPS: 37.5000 mg | ORAL_CAPSULE | ORAL | Status: DC

## 2012-05-31 MED ORDER — OMEPRAZOLE 20 MG PO CPDR: 20.0000 mg | DELAYED_RELEASE_CAPSULE | Freq: Every day | ORAL | Status: DC | PRN

## 2012-05-31 NOTE — Patient Instructions (Addendum)

## 2012-06-01 ENCOUNTER — Other Ambulatory Visit: Payer: Self-pay | Admitting: Family Medicine

## 2012-06-01 ENCOUNTER — Encounter: Payer: Self-pay | Admitting: Family Medicine

## 2012-06-01 DIAGNOSIS — L918 Other hypertrophic disorders of the skin: Secondary | ICD-10-CM

## 2012-06-01 DIAGNOSIS — R05 Cough: Secondary | ICD-10-CM

## 2012-06-01 DIAGNOSIS — J984 Other disorders of lung: Secondary | ICD-10-CM

## 2012-06-01 DIAGNOSIS — R059 Cough, unspecified: Secondary | ICD-10-CM

## 2012-06-01 HISTORY — DX: Other hypertrophic disorders of the skin: L91.8

## 2012-06-01 HISTORY — DX: Cough, unspecified: R05.9

## 2012-06-01 NOTE — Assessment & Plan Note (Signed)
cxr shows a possible nodule and cough is persistent. Check a CT chest.

## 2012-06-01 NOTE — Assessment & Plan Note (Signed)
Tolerating Phentermine and has been eating well and staying active. She takes the Phentermine as needed not on all days

## 2012-06-01 NOTE — Progress Notes (Signed)
Patient ID: Lisa Ray, female   DOB: 26-Apr-1971, 41 y.o.   MRN: [id number] Lisa Ray [id number] 09/22/1971 06/01/2012      Progress Note-Follow Up  Subjective  Chief Complaint  Chief Complaint  Patient presents with  . Follow-up    Medication Check: Phentermine  . Post Nasal Drip    Pt c/o post nasal drip [w/o sinus congestion and/or chest phlegm][  . Nevus    Pt request to have moles on back checked    HPI  Patient is a 41 year old Caucasian female who is in today for followup. She has been eating well and taking phentermine intermittently and is pleased with her weight loss. Overall says he feels well. No fevers or chills. Does note she has a persistent cough and feels as if there is congestion in her chest. Coughing intermittently productive of some white phlegm. No significant sinus congestion but she does note some postnasal drip. No headache or ear pain. No sore throat. No chest pain or palpitations, shortness of breath GI or GU complaints  Past Medical History  Diagnosis Date  . Migraines   . Chicken pox as a child    X 2  . Kidney stones 7-12    X 3  . Diverticulosis 2008  . Colon polyps 2008  . GERD (gastroesophageal reflux disease) 2008  . Erosive inflammation of stomach lining 2008  . Obesity 10/08/2011  . Preventative health care 10/13/2011  . Foot pain, right 10/13/2011  . Ovarian cyst, right   . Bronchitis 11/05/2011  . Left shoulder pain 01/04/2012  . Skin tag 06/01/2012  . Cough 06/01/2012    Past Surgical History  Procedure Date  . Lithotripsy 2008  . Tonsilectomy, adenoidectomy, bilateral myringotomy and tubes     Age 41  . Stent for kidney stone 7-12  . Ovarian cyst removal     Family History  Problem Relation Age of Onset  . Hypertension Mother   . Cancer Mother     skin, melanoma  . Migraines Mother   . Heart disease Maternal Grandmother   . Colon cancer Maternal Grandmother   . Colon cancer Maternal Grandfather   . Colon cancer  Maternal Grandfather   . Bipolar disorder Daughter     History   Social History  . Marital Status: Married    Spouse Name: Cielle Aguila    Number of Children: 3  . Years of Education: Assoc degr   Occupational History  . human resources     [employer]    Social History Main Topics  . Smoking status: Never Smoker   . Smokeless tobacco: Never Used  . Alcohol Use: 1.5 oz/week    3 drink(s) per week     occasionally  . Drug Use: No  . Sexually Active: Yes -- Female partner(s)   Other Topics Concern  . Not on file   Social History Narrative   Lives with her partner Naiomi Musto.     Current Outpatient Prescriptions on File Prior to Visit  Medication Sig Dispense Refill  . naproxen sodium (ANAPROX) 220 MG tablet Take 220 mg by mouth as needed.        . phentermine 37.5 MG capsule Take 1 capsule (37.5 mg total) by mouth every morning.  30 capsule  2  . rizatriptan (MAXALT) 10 MG tablet Take 1 tablet (10 mg total) by mouth as needed. May repeat in 2 hours if needed  18 tablet  1  . Acetaminophen-Aspirin Buffered 250-250 MG tablet Take  2 tablets by mouth at bedtime as needed.      . chlorpheniramine-HYDROcodone (TUSSIONEX) 10-8 MG/5ML LQCR Take 5 mLs by mouth every 12 (twelve) hours as needed.  140 mL  1  . fluticasone (FLONASE) 50 MCG/ACT nasal spray 2 sprays by Nasal route daily.  16 g  3  . omeprazole (PRILOSEC) 20 MG capsule Take 1 capsule (20 mg total) by mouth daily as needed (reflux).  30 capsule  3  . ranitidine (ZANTAC) 300 MG tablet         No Known Allergies  Review of Systems  Review of Systems  Constitutional: Positive for weight loss. Negative for fever and malaise/fatigue.  HENT: Positive for congestion.   Eyes: Negative for discharge.  Respiratory: Positive for cough. Negative for shortness of breath.   Cardiovascular: Negative for chest pain, palpitations and leg swelling.  Gastrointestinal: Negative for nausea, abdominal pain and diarrhea.  Genitourinary:  Negative for dysuria.  Musculoskeletal: Negative for falls.  Skin: Negative for rash.  Neurological: Positive for headaches. Negative for loss of consciousness.  Endo/Heme/Allergies: Negative for polydipsia.  Psychiatric/Behavioral: Negative for depression and suicidal ideas. The patient is not nervous/anxious and does not have insomnia.     Objective  BP 113/79  Pulse 92  Temp 98.4 F (36.9 C) (Oral)  Resp 16  Ht 5\' 2"  (1.575 m)  Wt 180 lb 12 oz (81.988 kg)  BMI 33.06 kg/m2  SpO2 98%  LMP 05/24/2012  Physical Exam  Physical Exam  Constitutional: She is oriented to person, place, and time and well-developed, well-nourished, and in no distress. No distress.  HENT:  Head: Normocephalic and atraumatic.  Right Ear: External ear normal.  Left Ear: External ear normal.  Nose: Nose normal.  Mouth/Throat: Oropharynx is clear and moist. No oropharyngeal exudate.  Eyes: Conjunctivae are normal. Pupils are equal, round, and reactive to light. Right eye exhibits no discharge. Left eye exhibits no discharge. No scleral icterus.  Neck: Normal range of motion. Neck supple. No thyromegaly present.  Cardiovascular: Normal rate, regular rhythm, normal heart sounds and intact distal pulses.   No murmur heard. Pulmonary/Chest: Effort normal and breath sounds normal. No respiratory distress. She has no wheezes. She has no rales.  Abdominal: Soft. Bowel sounds are normal. She exhibits no distension and no mass. There is no tenderness.  Musculoskeletal: Normal range of motion. She exhibits no edema.  Lymphadenopathy:    She has no cervical adenopathy.  Neurological: She is alert and oriented to person, place, and time. She has normal reflexes.  Skin: Skin is warm and dry. No rash noted. She is not diaphoretic.  Psychiatric: Mood, memory and affect normal.    Lab Results  Component Value Date   TSH 2.366 10/08/2011   Lab Results  Component Value Date   WBC 8.2 10/08/2011   HGB 14.2  10/08/2011   HCT 42.4 10/08/2011   MCV 85.8 10/08/2011   PLT 292 10/08/2011   Lab Results  Component Value Date   CREATININE 0.77 10/08/2011   BUN 10 10/08/2011   NA 135 10/08/2011   K 4.4 10/08/2011   CL 103 10/08/2011   CO2 25 10/08/2011   Lab Results  Component Value Date   ALT 11 10/08/2011   AST 17 10/08/2011   ALKPHOS 76 10/08/2011   BILITOT 0.4 10/08/2011   Lab Results  Component Value Date   CHOL 184 10/08/2011   Lab Results  Component Value Date   HDL 49 10/08/2011   Lab Results  Component Value Date   LDLCALC 105* 10/08/2011   Lab Results  Component Value Date   TRIG 152* 10/08/2011   Lab Results  Component Value Date   CHOLHDL 3.8 10/08/2011     Assessment & Plan  Skin tag Right posterior shoulder, no concerns   Obesity Tolerating Phentermine and has been eating well and staying active. She takes the Phentermine as needed not on all days  Cough cxr shows a possible nodule and cough is persistent. Check a CT chest.

## 2012-06-01 NOTE — Assessment & Plan Note (Signed)
Right posterior shoulder, no concerns

## 2012-06-03 ENCOUNTER — Telehealth: Payer: Self-pay

## 2012-06-03 ENCOUNTER — Other Ambulatory Visit (HOSPITAL_BASED_OUTPATIENT_CLINIC_OR_DEPARTMENT_OTHER): Payer: BC Managed Care – PPO

## 2012-06-03 ENCOUNTER — Ambulatory Visit (HOSPITAL_BASED_OUTPATIENT_CLINIC_OR_DEPARTMENT_OTHER)
Admission: RE | Admit: 2012-06-03 | Discharge: 2012-06-03 | Disposition: A | Discharge status: Home / Self Care | Payer: BC Managed Care – PPO | Source: Ambulatory Visit | Attending: Family Medicine | Admitting: Family Medicine

## 2012-06-03 DIAGNOSIS — R05 Cough: Secondary | ICD-10-CM | POA: Insufficient documentation

## 2012-06-03 DIAGNOSIS — J984 Other disorders of lung: Secondary | ICD-10-CM

## 2012-06-03 DIAGNOSIS — R059 Cough, unspecified: Secondary | ICD-10-CM

## 2012-06-03 NOTE — Telephone Encounter (Signed)
[facility] called stating pt is getting a CT today but there radiologist recommended without contrast and wanted to know if this was ok?  I informed her that this was ok

## 2012-06-04 ENCOUNTER — Encounter: Payer: Self-pay | Admitting: Family Medicine

## 2012-12-18 ENCOUNTER — Other Ambulatory Visit: Payer: Self-pay

## 2013-03-14 ENCOUNTER — Telehealth: Payer: Self-pay | Admitting: Family

## 2013-03-14 ENCOUNTER — Ambulatory Visit (INDEPENDENT_AMBULATORY_CARE_PROVIDER_SITE_OTHER): Payer: Federal, State, Local not specified - PPO | Admitting: Family

## 2013-03-14 ENCOUNTER — Ambulatory Visit (HOSPITAL_BASED_OUTPATIENT_CLINIC_OR_DEPARTMENT_OTHER)
Admission: RE | Admit: 2013-03-14 | Discharge: 2013-03-14 | Disposition: A | Discharge status: Home / Self Care | Payer: Federal, State, Local not specified - PPO | Source: Ambulatory Visit | Attending: Family | Admitting: Family

## 2013-03-14 ENCOUNTER — Encounter: Payer: Self-pay | Admitting: Family

## 2013-03-14 VITALS — BP 116/84 | HR 81 | Temp 98.2°F | Resp 16 | Ht 62.0 in | Wt 192.1 lb

## 2013-03-14 DIAGNOSIS — R911 Solitary pulmonary nodule: Secondary | ICD-10-CM | POA: Insufficient documentation

## 2013-03-14 DIAGNOSIS — R109 Unspecified abdominal pain: Secondary | ICD-10-CM | POA: Insufficient documentation

## 2013-03-14 DIAGNOSIS — K429 Umbilical hernia without obstruction or gangrene: Secondary | ICD-10-CM | POA: Insufficient documentation

## 2013-03-14 DIAGNOSIS — G44229 Chronic tension-type headache, not intractable: Secondary | ICD-10-CM

## 2013-03-14 DIAGNOSIS — K5730 Diverticulosis of large intestine without perforation or abscess without bleeding: Secondary | ICD-10-CM | POA: Insufficient documentation

## 2013-03-14 DIAGNOSIS — G5620 Lesion of ulnar nerve, unspecified upper limb: Secondary | ICD-10-CM | POA: Insufficient documentation

## 2013-03-14 DIAGNOSIS — Z0189 Encounter for other specified special examinations: Secondary | ICD-10-CM

## 2013-03-14 DIAGNOSIS — G43909 Migraine, unspecified, not intractable, without status migrainosus: Secondary | ICD-10-CM

## 2013-03-14 DIAGNOSIS — N200 Calculus of kidney: Secondary | ICD-10-CM | POA: Insufficient documentation

## 2013-03-14 LAB — POCT URINE HCG BY VISUAL COLOR COMPARISON TESTS: Preg Test, Ur: NEGATIVE

## 2013-03-14 LAB — POCT URINALYSIS DIPSTICK
Bilirubin, UA: NEGATIVE
Glucose, UA: NEGATIVE
Nitrite, UA: NEGATIVE

## 2013-03-14 MED ORDER — CIPROFLOXACIN HCL 500 MG PO TABS: 500.0000 mg | ORAL_TABLET | Freq: Two times a day (BID) | ORAL | Status: DC

## 2013-03-14 MED ORDER — RIZATRIPTAN BENZOATE 10 MG PO TABS: 10.0000 mg | ORAL_TABLET | ORAL | Status: DC | PRN

## 2013-03-14 NOTE — Progress Notes (Signed)
Subjective:    Patient ID: Lisa Ray, female    DOB: 07/19/1971, 42 y.o.   MRN: [id number]  HPI  Lisa Ray is a 42 yr old female who presents today with several concerns.  1) Flank pain- pain is bilateral and is constant.  Pain started 1 week ago. She has hx of recurrent nephrolithaisis.  Spotting from period.  Denies fever. Drinking lots of water last few months.    2) Wrist pain- reports stiffness of both hands.  Works at a [employer].  Pain radiates up to her elbows.  3) Headache- She reports that she continues to have frequent tension headaches.  Reports that migraines are infrequent and well controlled with use of PRN maxalt.      Review of Systems See HPI  Past Medical History  Diagnosis Date  . Migraines   . Chicken pox as a child    X 2  . Kidney stones 7-12    X 3  . Diverticulosis 2008  . Colon polyps 2008  . GERD (gastroesophageal reflux disease) 2008  . Erosive inflammation of stomach lining 2008  . Obesity 10/08/2011  . Preventative health care 10/13/2011  . Foot pain, right 10/13/2011  . Ovarian cyst, right   . Bronchitis 11/05/2011  . Left shoulder pain 01/04/2012  . Skin tag 06/01/2012  . Cough 06/01/2012    History   Social History  . Marital Status: Married    Spouse Name: Lisa Ray    Number of Children: 3  . Years of Education: Assoc degr   Occupational History  . human resources     [employer]    Social History Main Topics  . Smoking status: Never Smoker   . Smokeless tobacco: Never Used  . Alcohol Use: 1.5 oz/week    3 drink(s) per week     Comment: occasionally  . Drug Use: No  . Sexually Active: Yes -- Female partner(s)   Other Topics Concern  . Not on file   Social History Narrative   Lives with her partner Lisa Ray.           Past Surgical History  Procedure Laterality Date  . Lithotripsy  2008  . Tonsilectomy, adenoidectomy, bilateral myringotomy and tubes      Age 14  . Stent for kidney stone  7-12  . Ovarian cyst  removal      Family History  Problem Relation Age of Onset  . Hypertension Mother   . Cancer Mother     skin, melanoma  . Migraines Mother   . Heart disease Maternal Grandmother   . Colon cancer Maternal Grandmother   . Colon cancer Maternal Grandfather   . Colon cancer Maternal Grandfather   . Bipolar disorder Daughter     No Known Allergies  Current Outpatient Prescriptions on File Prior to Visit  Medication Sig Dispense Refill  . chlorpheniramine-HYDROcodone (TUSSIONEX) 10-8 MG/5ML LQCR Take 5 mLs by mouth every 12 (twelve) hours as needed.  140 mL  1  . omeprazole (PRILOSEC) 20 MG capsule Take 1 capsule (20 mg total) by mouth daily as needed (reflux).  30 capsule  3  . Acetaminophen-Aspirin Buffered 250-250 MG tablet Take 2 tablets by mouth at bedtime as needed.       No current facility-administered medications on file prior to visit.    BP 116/84  Pulse 81  Temp(Src) 98.2 F (36.8 C) (Oral)  Resp 16  Ht 5\' 2"  (1.575 m)  Wt 192 lb 1.9 oz (87.145  kg)  BMI 35.13 kg/m2  SpO2 99%  LMP 03/14/2013       Objective:   Physical Exam  Constitutional: She is oriented to person, place, and time. She appears well-developed and well-nourished. No distress.  HENT:  Head: Normocephalic and atraumatic.  Cardiovascular: Normal rate and regular rhythm.   No murmur heard. Pulmonary/Chest: Effort normal and breath sounds normal. No respiratory distress. She has no wheezes. She has no rales. She exhibits no tenderness.  Musculoskeletal: She exhibits no edema.  Neg phalans, neg tinels.  Lymphadenopathy:    She has no cervical adenopathy.  Neurological: She is alert and oriented to person, place, and time.  Skin: Skin is warm and dry.  Psychiatric: She has a normal mood and affect. Her behavior is normal. Judgment and thought content normal.          Assessment & Plan:

## 2013-03-14 NOTE — Assessment & Plan Note (Signed)
Bilateral.  Pt reports leaning her forearms on her desk due to configuration of keyboard.  We discussed that she is likely causing irritation of the ulnar nerves which is causing her ongoing pain.  Recommended that she rearrange desk for better body mechanics.

## 2013-03-14 NOTE — Assessment & Plan Note (Addendum)
UA notes + blood, + leuks.  CT abd pelvis, neg for obstructing stone.  Will plan to culture urine, plan empiric rx with cipro.  Pain could be MS in nature.   CT did note diverticulosis (not new) and 2mm RLL lung nodule in this non smoker.  No further work up needed given low risk.  Incidental not also made of very small inguinal hernia.  She is asymptomatic.  We reviewed s/s of strangulated hernia and pt understands need to go to the ED if these symptoms occur.

## 2013-03-14 NOTE — Assessment & Plan Note (Signed)
Recommended sparing use of aleve.  Otherwise use PRN tylenol.

## 2013-03-14 NOTE — Patient Instructions (Addendum)
Please complete your CT scan on the first floor. Call if symptoms worsen or if not improved in 2-3 days.

## 2013-03-14 NOTE — Telephone Encounter (Signed)
Called pt. Reviewed CT scan.  Note 2mm lung nodule. She is a non-smoker.  Low risk.  Advised pt to call if symptoms worsen, or if not improved in 2-3 days.  Start cipro.  Pt verbalizes understanding.

## 2013-03-15 LAB — URINE CULTURE
Colony Count: NO GROWTH
Organism ID, Bacteria: NO GROWTH

## 2013-03-23 ENCOUNTER — Encounter: Payer: Self-pay | Admitting: Family Medicine

## 2013-03-23 ENCOUNTER — Ambulatory Visit (INDEPENDENT_AMBULATORY_CARE_PROVIDER_SITE_OTHER): Payer: Federal, State, Local not specified - PPO | Admitting: Family Medicine

## 2013-03-23 VITALS — BP 118/82 | HR 73 | Temp 98.1°F | Ht 63.0 in | Wt 194.0 lb

## 2013-03-23 DIAGNOSIS — K5730 Diverticulosis of large intestine without perforation or abscess without bleeding: Secondary | ICD-10-CM

## 2013-03-23 DIAGNOSIS — K429 Umbilical hernia without obstruction or gangrene: Secondary | ICD-10-CM

## 2013-03-23 DIAGNOSIS — R911 Solitary pulmonary nodule: Secondary | ICD-10-CM

## 2013-03-23 DIAGNOSIS — K5790 Diverticulosis of intestine, part unspecified, without perforation or abscess without bleeding: Secondary | ICD-10-CM

## 2013-03-23 DIAGNOSIS — N200 Calculus of kidney: Secondary | ICD-10-CM

## 2013-03-23 MED ORDER — PROMETHAZINE HCL 25 MG PO TABS: 25.0000 mg | ORAL_TABLET | Freq: Three times a day (TID) | ORAL | Status: DC | PRN

## 2013-03-23 MED ORDER — HYDROCODONE-ACETAMINOPHEN 10-325 MG PO TABS: 1.0000 | ORAL_TABLET | Freq: Three times a day (TID) | ORAL | Status: DC | PRN

## 2013-03-23 NOTE — Patient Instructions (Addendum)
Next visit annual with labs prior, lipid, renal, hepatic, tsh, cbc   Hernia A hernia occurs when an internal organ pushes out through a weak spot in the abdominal wall. Hernias most commonly occur in the groin and around the navel. Hernias often can be pushed back into place (reduced). Most hernias tend to get worse over time. Some abdominal hernias can get stuck in the opening (irreducible or incarcerated hernia) and cannot be reduced. An irreducible abdominal hernia which is tightly squeezed into the opening is at risk for impaired blood supply (strangulated hernia). A strangulated hernia is a medical emergency. Because of the risk for an irreducible or strangulated hernia, surgery may be recommended to repair a hernia. CAUSES   Heavy lifting.  Prolonged coughing.  Straining to have a bowel movement.  A cut (incision) made during an abdominal surgery. HOME CARE INSTRUCTIONS   Bed rest is not required. You may continue your normal activities.  Avoid lifting more than 10 pounds (4.5 kg) or straining.  Cough gently. If you are a smoker it is best to stop. Even the best hernia repair can break down with the continual strain of coughing. Even if you do not have your hernia repaired, a cough will continue to aggravate the problem.  Do not wear anything tight over your hernia. Do not try to keep it in with an outside bandage or truss. These can damage abdominal contents if they are trapped within the hernia sac.  Eat a normal diet.  Avoid constipation. Straining over long periods of time will increase hernia size and encourage breakdown of repairs. If you cannot do this with diet alone, stool softeners may be used. SEEK IMMEDIATE MEDICAL CARE IF:   You have a fever.  You develop increasing abdominal pain.  You feel nauseous or vomit.  Your hernia is stuck outside the abdomen, looks discolored, feels hard, or is tender.  You have any changes in your bowel habits or in the hernia that  are unusual for you.  You have increased pain or swelling around the hernia.  You cannot push the hernia back in place by applying gentle pressure while lying down. MAKE SURE YOU:   Understand these instructions.  Will watch your condition.  Will get help right away if you are not doing well or get worse. Document Released: 10/20/2005 Document Revised: 01/12/2012 Document Reviewed: 06/08/2008 [facility] Patient Information 2014 [place], [place].

## 2013-03-25 ENCOUNTER — Encounter: Payer: Self-pay | Admitting: Family Medicine

## 2013-03-25 DIAGNOSIS — K5790 Diverticulosis of intestine, part unspecified, without perforation or abscess without bleeding: Secondary | ICD-10-CM | POA: Insufficient documentation

## 2013-03-25 DIAGNOSIS — R911 Solitary pulmonary nodule: Secondary | ICD-10-CM | POA: Insufficient documentation

## 2013-03-25 HISTORY — DX: Solitary pulmonary nodule: R91.1

## 2013-03-25 NOTE — Assessment & Plan Note (Signed)
Multiple symptomatic episodes. Referred to urology. Given a kit to help her catch a stone so we can analyze it. Maintain adequate hydration

## 2013-03-25 NOTE — Assessment & Plan Note (Signed)
Patient at low risk for bronchogenic CA so per radiology does not need f/u CT scan unless symptoms develop

## 2013-03-25 NOTE — Progress Notes (Signed)
Patient ID: Lisa Ray, female   DOB: 1971-08-01, 42 y.o.   MRN: [id number] Lisa Ray [id number] 29-Jul-1971 03/25/2013      Progress Note-Follow Up  Subjective  Chief Complaint  Chief Complaint  Patient presents with  . Follow-up    on CT results    HPI  Patient is a 42 yo caucasian for follow up on kidney stones and CT results. No revers, chills, cp, palp, sob, GI complaints. Urinary frequenc.y is noted but no dysuria or hematuria. Has been trying to hydrate well  Past Medical History  Diagnosis Date  . Migraines   . Chicken pox as a child    X 2  . Kidney stones 7-12    X 3  . Diverticulosis 2008  . Colon polyps 2008  . GERD (gastroesophageal reflux disease) 2008  . Erosive inflammation of stomach lining 2008  . Obesity 10/08/2011  . Preventative health care 10/13/2011  . Foot pain, right 10/13/2011  . Ovarian cyst, right   . Bronchitis 11/05/2011  . Left shoulder pain 01/04/2012  . Skin tag 06/01/2012  . Cough 06/01/2012  . Diverticulosis 03/25/2013  . Solitary pulmonary nodule 03/25/2013    Past Surgical History  Procedure Laterality Date  . Lithotripsy  2008  . Tonsilectomy, adenoidectomy, bilateral myringotomy and tubes      Age 20  . Stent for kidney stone  7-12  . Ovarian cyst removal      Family History  Problem Relation Age of Onset  . Hypertension Mother   . Cancer Mother     skin, melanoma  . Migraines Mother   . Heart disease Maternal Grandmother   . Colon cancer Maternal Grandmother   . Colon cancer Maternal Grandfather   . Colon cancer Maternal Grandfather   . Bipolar disorder Daughter     History   Social History  . Marital Status: Married    Spouse Name: Anderson Coppock    Number of Children: 3  . Years of Education: Assoc degr   Occupational History  . human resources     [employer]    Social History Main Topics  . Smoking status: Never Smoker   . Smokeless tobacco: Never Used  . Alcohol Use: 1.5 oz/week    3 drink(s) per  week     Comment: occasionally  . Drug Use: No  . Sexually Active: Yes -- Female partner(s)   Other Topics Concern  . Not on file   Social History Narrative   Lives with her partner Yeraldy Spike.           Current Outpatient Prescriptions on File Prior to Visit  Medication Sig Dispense Refill  . omeprazole (PRILOSEC) 20 MG capsule Take 1 capsule (20 mg total) by mouth daily as needed (reflux).  30 capsule  3  . rizatriptan (MAXALT) 10 MG tablet Take 1 tablet (10 mg total) by mouth as needed. May repeat in 2 hours if needed  18 tablet  1   No current facility-administered medications on file prior to visit.    No Known Allergies  Review of Systems  Review of Systems  Constitutional: Negative for fever and malaise/fatigue.  HENT: Negative for congestion.   Eyes: Negative for discharge.  Respiratory: Negative for shortness of breath.   Cardiovascular: Negative for chest pain, palpitations and leg swelling.  Gastrointestinal: Negative for nausea, abdominal pain and diarrhea.  Genitourinary: Positive for flank pain. Negative for dysuria.  Musculoskeletal: Negative for falls.  Skin: Negative for rash.  Neurological: Negative for loss of consciousness and headaches.  Endo/Heme/Allergies: Negative for polydipsia.  Psychiatric/Behavioral: Negative for depression and suicidal ideas. The patient is not nervous/anxious and does not have insomnia.     Objective  BP 118/82  Pulse 73  Temp(Src) 98.1 F (36.7 C) (Oral)  Ht 5\' 3"  (1.6 m)  Wt 194 lb 0.6 oz (88.016 kg)  BMI 34.38 kg/m2  SpO2 96%  LMP 03/14/2013  Physical Exam  Physical Exam  Constitutional: She is oriented to person, place, and time and well-developed, well-nourished, and in no distress. No distress.  HENT:  Head: Normocephalic and atraumatic.  Eyes: Conjunctivae are normal.  Neck: Neck supple. No thyromegaly present.  Cardiovascular: Normal rate, regular rhythm and normal heart sounds.   No murmur  heard. Pulmonary/Chest: Effort normal and breath sounds normal. She has no wheezes.  Abdominal: She exhibits no distension and no mass.  Musculoskeletal: She exhibits no edema.  Lymphadenopathy:    She has no cervical adenopathy.  Neurological: She is alert and oriented to person, place, and time.  Skin: Skin is warm and dry. No rash noted. She is not diaphoretic.  Psychiatric: Memory, affect and judgment normal.    Lab Results  Component Value Date   TSH 2.366 10/08/2011   Lab Results  Component Value Date   WBC 8.2 10/08/2011   HGB 14.2 10/08/2011   HCT 42.4 10/08/2011   MCV 85.8 10/08/2011   PLT 292 10/08/2011   Lab Results  Component Value Date   CREATININE 0.77 10/08/2011   BUN 10 10/08/2011   NA 135 10/08/2011   K 4.4 10/08/2011   CL 103 10/08/2011   CO2 25 10/08/2011   Lab Results  Component Value Date   ALT 11 10/08/2011   AST 17 10/08/2011   ALKPHOS 76 10/08/2011   BILITOT 0.4 10/08/2011   Lab Results  Component Value Date   CHOL 184 10/08/2011   Lab Results  Component Value Date   HDL 49 10/08/2011   Lab Results  Component Value Date   LDLCALC 105* 10/08/2011   Lab Results  Component Value Date   TRIG 152* 10/08/2011   Lab Results  Component Value Date   CHOLHDL 3.8 10/08/2011     Assessment & Plan  Kidney stones Multiple symptomatic episodes. Referred to urology. Given a kit to help her catch a stone so we can analyze it. Maintain adequate hydration  Solitary pulmonary nodule Patient at low risk for bronchogenic CA so per radiology does not need f/u CT scan unless symptoms develop

## 2013-04-06 ENCOUNTER — Ambulatory Visit (INDEPENDENT_AMBULATORY_CARE_PROVIDER_SITE_OTHER): Payer: Federal, State, Local not specified - PPO | Admitting: Surgery

## 2013-04-25 ENCOUNTER — Encounter (INDEPENDENT_AMBULATORY_CARE_PROVIDER_SITE_OTHER): Payer: Self-pay | Admitting: Surgery

## 2013-06-09 ENCOUNTER — Encounter: Payer: Self-pay | Admitting: Physician Assistant

## 2013-06-09 ENCOUNTER — Ambulatory Visit (INDEPENDENT_AMBULATORY_CARE_PROVIDER_SITE_OTHER): Payer: Federal, State, Local not specified - PPO | Admitting: Physician Assistant

## 2013-06-09 VITALS — BP 106/72 | HR 79 | Temp 97.8°F | Resp 14 | Wt 198.5 lb

## 2013-06-09 DIAGNOSIS — IMO0002 Reserved for concepts with insufficient information to code with codable children: Secondary | ICD-10-CM

## 2013-06-09 DIAGNOSIS — T82898A Other specified complication of vascular prosthetic devices, implants and grafts, initial encounter: Secondary | ICD-10-CM

## 2013-06-09 NOTE — Progress Notes (Signed)
Patient ID: Lisa Ray, female   DOB: 1971-08-12, 42 y.o.   MRN: [id number]   Patient is a 42 year-old caucasian female who presents complaining of a knot on her right forearm that she noticed after she gave blood today.  Patient felt the knot "pop" and has had some tenderness since.  Denies decrease in ROM. Denies fever, chills.  Denies redness at phlebotomy site.  Otherwise feels well.  Past Medical History  Diagnosis Date  . Migraines   . Chicken pox as a child    X 2  . Kidney stones 7-12    X 3  . Diverticulosis 2008  . Colon polyps 2008  . GERD (gastroesophageal reflux disease) 2008  . Erosive inflammation of stomach lining 2008  . Obesity 10/08/2011  . Preventative health care 10/13/2011  . Foot pain, right 10/13/2011  . Ovarian cyst, right   . Bronchitis 11/05/2011  . Left shoulder pain 01/04/2012  . Skin tag 06/01/2012  . Cough 06/01/2012  . Diverticulosis 03/25/2013  . Solitary pulmonary nodule 03/25/2013   Current Outpatient Prescriptions on File Prior to Visit  Medication Sig Dispense Refill  . rizatriptan (MAXALT) 10 MG tablet Take 1 tablet (10 mg total) by mouth as needed. May repeat in 2 hours if needed  18 tablet  1   No current facility-administered medications on file prior to visit.   No Known Allergies  Family History  Problem Relation Age of Onset  . Hypertension Mother   . Cancer Mother     skin, melanoma  . Migraines Mother   . Heart disease Maternal Grandmother   . Colon cancer Maternal Grandmother   . Colon cancer Maternal Grandfather   . Colon cancer Maternal Grandfather   . Bipolar disorder Daughter    History   Social History  . Marital Status: Married    Spouse Name: Zeidy Tayag    Number of Children: 3  . Years of Education: Assoc degr   Occupational History  . human resources     [employer]    Social History Main Topics  . Smoking status: Never Smoker   . Smokeless tobacco: Never Used  . Alcohol Use: 1.5 oz/week    3 drink(s) per  week     Comment: occasionally  . Drug Use: No  . Sexually Active: Yes -- Female partner(s)   Other Topics Concern  . None   Social History Narrative   Lives with her partner Myrna Vonseggern.          Review of Systems  Constitutional: Negative for fever and chills.  Skin: Negative for rash.  Neurological: Positive for headaches. Negative for dizziness.    Filed Vitals:   06/09/13 1604  BP: 106/72  Pulse: 79  Temp: 97.8 F (36.6 C)  Resp: 14   Physical Exam  Vitals reviewed. Constitutional: She is well-developed, well-nourished, and in no distress.  HENT:  Head: Normocephalic and atraumatic.  Eyes: Conjunctivae are normal. Pupils are equal, round, and reactive to light.  Musculoskeletal: Normal range of motion.  Skin: Skin is warm and dry. No rash noted.  Presence of a 1 cm soft nodule in R antecubital region at site of venipuncture, with associated ecchymosis.      Assessment/Plan: Hematoma of IV site Benign hematoma 2/2 venipuncture.  Will resolve on its own. May have associated bruising.  Ice pack to site and/or OTC for pain relief.

## 2013-06-09 NOTE — Patient Instructions (Signed)
  Use an ice pack and applied to area if needed for pain relief.  Can take OTC tylenol for pain.  This should resolve on its own.  You may have some residual bruising but this will fade away.   Hematoma A hematoma is a pocket of blood that collects under the skin, in an organ, in a body space, in a joint space, or in other tissue. The blood can clot to form a lump that you can see and feel. The lump is often firm, sore, and sometimes even painful and tender. Most hematomas get better in a few days to weeks. However, some hematomas may be serious and require medical care.Hematomas can range in size from very small to very large. CAUSES  A hematoma can be caused by a blunt or penetrating injury. It can also be caused by leakage from a blood vessel under the skin. Spontaneous leakage from a blood vessel is more likely to occur in elderly people, especially those taking blood thinners. Sometimes, a hematoma can develop after certain medical procedures. SYMPTOMS  Unlike a bruise, a hematoma forms a firm lump that you can feel. This lump is the collection of blood. The collection of blood can also cause your skin to turn a blue to dark blue color. If the hematoma is close to the surface of the skin, it often produces a yellowish color in the skin. DIAGNOSIS  Your caregiver can determine whether you have a hematoma based on your history and a physical exam. TREATMENT  Hematomas usually go away on their own over time. Rarely does the blood need to be drained out of the body. HOME CARE INSTRUCTIONS   Put ice on the injured area.  Put ice in a plastic bag.  Place a towel between your skin and the bag.  Leave the ice on for 15-20 minutes, 3-4 times a day for the first 1 to 2 days.  After the first 2 days, switch to using warm compresses on the hematoma.  Elevate the injured area to help decrease pain and swelling. Wrapping the area with an elastic bandage may also be helpful. Compression helps to  reduce swelling and promotes shrinking of the hematoma. Make sure the bandage is not wrapped too tight.  If your hematoma is on a lower extremity and is painful, crutches may be helpful for a couple days.  Only take over-the-counter or prescription medicines for pain, discomfort, or fever as directed by your caregiver. Most patients can take acetaminophen or ibuprofen for the pain. SEEK IMMEDIATE MEDICAL CARE IF:   You have increasing pain, or your pain is not controlled with medicine.  You have a fever.  You have worsening swelling or discoloration.  Your skin over the hematoma breaks or starts bleeding. MAKE SURE YOU:   Understand these instructions.  Will watch your condition.  Will get help right away if you are not doing well or get worse. Document Released: 06/03/2004 Document Revised: 01/12/2012 Document Reviewed: 06/23/2011 [facility] Patient Information 2014 [place], [place].

## 2013-06-09 NOTE — Assessment & Plan Note (Signed)
Benign hematoma 2/2 venipuncture.  Will resolve on its own. May have associated bruising.  Ice pack to site and/or OTC for pain relief.

## 2013-07-12 ENCOUNTER — Encounter: Payer: Self-pay | Admitting: Physician Assistant

## 2013-07-12 ENCOUNTER — Ambulatory Visit (INDEPENDENT_AMBULATORY_CARE_PROVIDER_SITE_OTHER): Payer: Federal, State, Local not specified - PPO | Admitting: Physician Assistant

## 2013-07-12 ENCOUNTER — Ambulatory Visit (HOSPITAL_BASED_OUTPATIENT_CLINIC_OR_DEPARTMENT_OTHER)
Admission: RE | Admit: 2013-07-12 | Discharge: 2013-07-12 | Disposition: A | Discharge status: Home / Self Care | Payer: Federal, State, Local not specified - PPO | Source: Ambulatory Visit | Attending: Physician Assistant | Admitting: Physician Assistant

## 2013-07-12 VITALS — BP 118/82 | HR 90 | Temp 98.4°F | Ht 62.0 in | Wt 198.1 lb

## 2013-07-12 DIAGNOSIS — K589 Irritable bowel syndrome without diarrhea: Secondary | ICD-10-CM | POA: Insufficient documentation

## 2013-07-12 DIAGNOSIS — R197 Diarrhea, unspecified: Secondary | ICD-10-CM | POA: Insufficient documentation

## 2013-07-12 DIAGNOSIS — Z23 Encounter for immunization: Secondary | ICD-10-CM | POA: Insufficient documentation

## 2013-07-12 DIAGNOSIS — R110 Nausea: Secondary | ICD-10-CM | POA: Insufficient documentation

## 2013-07-12 DIAGNOSIS — R195 Other fecal abnormalities: Secondary | ICD-10-CM

## 2013-07-12 DIAGNOSIS — R109 Unspecified abdominal pain: Secondary | ICD-10-CM | POA: Insufficient documentation

## 2013-07-12 LAB — CBC WITH DIFFERENTIAL/PLATELET
Basophils Absolute: 0 10*3/uL (ref 0.0–0.1)
Basophils Relative: 1 % (ref 0–1)
Eosinophils Absolute: 0.3 10*3/uL (ref 0.0–0.7)
Hemoglobin: 12.6 g/dL (ref 12.0–15.0)
MCHC: 33.3 g/dL (ref 30.0–36.0)
Neutro Abs: 2.8 10*3/uL (ref 1.7–7.7)
Neutrophils Relative %: 44 % (ref 43–77)
Platelets: 313 10*3/uL (ref 150–400)
RDW: 13.4 % (ref 11.5–15.5)

## 2013-07-12 NOTE — Assessment & Plan Note (Signed)
Influenza vaccination given by nursing staff 

## 2013-07-12 NOTE — Progress Notes (Signed)
Patient ID: Lisa Ray, female   DOB: [birth year], 42 y.o.   MRN: [id number]  Patient presents to clinic today c/o abdominal cramping and frequent loose stools over the past 1-1.5 weeks.  Patient states symptoms are present throughout the day but are worse in the morning.  Patient endorses that abdominal cramping is diffuse and improves with defecation.  Denies tenesmus, melena or hematochezia.  Denies urinary symptoms.  Denies fever, chills, sweats.  Endorses one episode of nausea a few days ago but denies emesis.  Patient denies daily probiotic use or fiber supplement but does state she has a greek yogurt some mornings.  Patient endorses coffee consumption each am.  Last bowel movement was this morning and semi-solid.  No vaginal symptoms.  Has history of GERD but has not been taking omeprazole.  Endorses some indigestion since cessation of medication.  Patient also with history of diverticulitis.  Denies sharp LLQ pain.  Knows which foods to avoid.  Patient endorses that she has had recent increase in stress with work and home -- kids restarting school and having issues, in the process of selling her house, etc.  Past Medical History  Diagnosis Date  . Migraines   . Chicken pox as a child    X 2  . Kidney stones 7-12    X 3  . Diverticulosis 2008  . Colon polyps 2008  . GERD (gastroesophageal reflux disease) 2008  . Erosive inflammation of stomach lining 2008  . Obesity 10/08/2011  . Preventative health care 10/13/2011  . Foot pain, right 10/13/2011  . Ovarian cyst, right   . Bronchitis 11/05/2011  . Left shoulder pain 01/04/2012  . Skin tag 06/01/2012  . Cough 06/01/2012  . Diverticulosis 03/25/2013  . Solitary pulmonary nodule 03/25/2013    Current Outpatient Prescriptions on File Prior to Visit  Medication Sig Dispense Refill  . omeprazole (PRILOSEC) 40 MG capsule Take 40 mg by mouth daily.      . rizatriptan (MAXALT) 10 MG tablet Take 1 tablet (10 mg total) by mouth as needed. May  repeat in 2 hours if needed  18 tablet  1   No current facility-administered medications on file prior to visit.    No Known Allergies  Family History  Problem Relation Age of Onset  . Hypertension Mother   . Cancer Mother     skin, melanoma  . Migraines Mother   . Heart disease Maternal Grandmother   . Colon cancer Maternal Grandmother   . Colon cancer Maternal Grandfather   . Colon cancer Maternal Grandfather   . Bipolar disorder Daughter     History   Social History  . Marital Status: Married    Spouse Name: Sammye Staff    Number of Children: 3  . Years of Education: Assoc degr   Occupational History  . human resources     [employer]    Social History Main Topics  . Smoking status: Never Smoker   . Smokeless tobacco: Never Used  . Alcohol Use: 1.5 oz/week    3 drink(s) per week     Comment: occasionally  . Drug Use: No  . Sexual Activity: Yes    Partners: Male   Other Topics Concern  . None   Social History Narrative   Lives with her partner Laverne Hursey.           Review of Systems  Constitutional: Negative for fever, chills, weight loss and malaise/fatigue.  Respiratory: Negative for cough, shortness of breath and  wheezing.   Cardiovascular: Negative for chest pain and palpitations.  Gastrointestinal: Positive for heartburn, nausea and diarrhea. Negative for vomiting, constipation, blood in stool and melena.       Abdominal cramping  Genitourinary: Negative for dysuria, urgency, frequency, hematuria and flank pain.  Musculoskeletal: Negative for myalgias.   Filed Vitals:   07/12/13 0823  BP: 118/82  Pulse: 90  Temp: 98.4 F (36.9 C)    Physical Exam  Vitals reviewed. Constitutional: She is oriented to person, place, and time and well-developed, well-nourished, and in no distress.  HENT:  Head: Normocephalic and atraumatic.  Mouth/Throat: Oropharynx is clear and moist.  Eyes: Conjunctivae are normal. Pupils are equal, round, and reactive to  light.  Neck: Neck supple.  Cardiovascular: Normal rate, regular rhythm, normal heart sounds and intact distal pulses.   Pulmonary/Chest: Effort normal and breath sounds normal.  Abdominal: Soft. Bowel sounds are normal. She exhibits no distension and no mass. There is no rebound and no guarding.  Mild diffuse tenderness over LLQ and RLQ noted with light and deep palpation  Lymphadenopathy:    She has no cervical adenopathy.  Neurological: She is alert and oriented to person, place, and time.  Skin: Skin is warm and dry. No rash noted.   Assessment/Plan: Need for prophylactic vaccination and inoculation against influenza Influenza vaccination given by nursing staff.  Passage of loose stools Symptoms sound mostly consistent with IBS given relief of cramping with defecation and recent increase in stressors.  Encourage daily probiotic, daily fiber supplement, adequate fluid intake.  Reduce amount of am coffee. Will obtain CBC and XRay of abdomen.  Follow-up in 2 weeks if symptoms persist or worsen.

## 2013-07-12 NOTE — Patient Instructions (Signed)
Please start daily probiotic supplement and daily fiber supplement.  Avoid excessive caffeine (coffee) in the morning.  I will call you with your test results.  Follow-up in 2 weeks if symptoms persist or worsen.  Irritable Bowel Syndrome Irritable Bowel Syndrome (IBS) is caused by a disturbance of normal bowel function. Other terms used are spastic colon, mucous colitis, and irritable colon. It does not require surgery, nor does it lead to cancer. There is no cure for IBS. But with proper diet, stress reduction, and medication, you will find that your problems (symptoms) will gradually disappear or improve. IBS is a common digestive disorder. It usually appears in late adolescence or early adulthood. Women develop it twice as often as men. CAUSES  After food has been digested and absorbed in the small intestine, waste material is moved into the colon (large intestine). In the colon, water and salts are absorbed from the undigested products coming from the small intestine. The remaining residue, or fecal material, is held for elimination. Under normal circumstances, gentle, rhythmic contractions on the bowel walls push the fecal material along the colon towards the rectum. In IBS, however, these contractions are irregular and poorly coordinated. The fecal material is either retained too long, resulting in constipation, or expelled too soon, producing diarrhea. SYMPTOMS  The most common symptom of IBS is pain. It is typically in the lower left side of the belly (abdomen). But it may occur anywhere in the abdomen. It can be felt as heartburn, backache, or even as a dull pain in the arms or shoulders. The pain comes from excessive bowel-muscle spasms and from the buildup of gas and fecal material in the colon. This pain:  Can range from sharp belly (abdominal) cramps to a dull, continuous ache.  Usually worsens soon after eating.  Is typically relieved by having a bowel movement or passing gas. Abdominal  pain is usually accompanied by constipation. But it may also produce diarrhea. The diarrhea typically occurs right after a meal or upon arising in the morning. The stools are typically soft and watery. They are often flecked with secretions (mucus). Other symptoms of IBS include:  Bloating.  Loss of appetite.  Heartburn.  Feeling sick to your stomach (nausea).  Belching  Vomiting  Gas. IBS may also cause a number of symptoms that are unrelated to the digestive system:  Fatigue.  Headaches.  Anxiety  Shortness of breath  Difficulty in concentrating.  Dizziness. These symptoms tend to come and go. DIAGNOSIS  The symptoms of IBS closely mimic the symptoms of other, more serious digestive disorders. So your caregiver may wish to perform a variety of additional tests to exclude these disorders. He/she wants to be certain of learning what is wrong (diagnosis). The nature and purpose of each test will be explained to you. TREATMENT A number of medications are available to help correct bowel function and/or relieve bowel spasms and abdominal pain. Among the drugs available are:  Mild, non-irritating laxatives for severe constipation and to help restore normal bowel habits.  Specific anti-diarrheal medications to treat severe or prolonged diarrhea.  Anti-spasmodic agents to relieve intestinal cramps.  Your caregiver may also decide to treat you with a mild tranquilizer or sedative during unusually stressful periods in your life. The important thing to remember is that if any drug is prescribed for you, make sure that you take it exactly as directed. Make sure that your caregiver knows how well it worked for you. HOME CARE INSTRUCTIONS   Avoid foods  that are high in fat or oils. Some examples [id number] cream, butter, frankfurters, sausage, and other fatty meats.  Avoid foods that have a laxative effect, such as fruit, fruit juice, and dairy products.  Cut out carbonated drinks,  chewing gum, and "gassy" foods, such as beans and cabbage. This may help relieve bloating and belching.  Bran taken with plenty of liquids may help relieve constipation.  Keep track of what foods seem to trigger your symptoms.  Avoid emotionally charged situations or circumstances that produce anxiety.  Start or continue exercising.  Get plenty of rest and sleep. MAKE SURE YOU:   Understand these instructions.  Will watch your condition.  Will get help right away if you are not doing well or get worse. Document Released: 10/20/2005 Document Revised: 01/12/2012 Document Reviewed: 06/09/2008 [facility] Patient Information 2014 [place], [place].

## 2013-07-12 NOTE — Assessment & Plan Note (Signed)
Symptoms sound mostly consistent with IBS given relief of cramping with defecation and recent increase in stressors.  Encourage daily probiotic, daily fiber supplement, adequate fluid intake.  Reduce amount of am coffee. Will obtain CBC and XRay of abdomen.  Follow-up in 2 weeks if symptoms persist or worsen.

## 2013-07-25 ENCOUNTER — Ambulatory Visit (INDEPENDENT_AMBULATORY_CARE_PROVIDER_SITE_OTHER): Payer: Federal, State, Local not specified - PPO | Admitting: Physician Assistant

## 2013-07-25 ENCOUNTER — Encounter: Payer: Self-pay | Admitting: Physician Assistant

## 2013-07-25 VITALS — BP 114/76 | HR 89 | Temp 98.2°F | Resp 16 | Ht 62.0 in | Wt 200.8 lb

## 2013-07-25 DIAGNOSIS — K589 Irritable bowel syndrome without diarrhea: Secondary | ICD-10-CM

## 2013-07-25 NOTE — Progress Notes (Signed)
Patient ID: Lisa Ray, female   DOB: 19-Oct-1971, 42 y.o.   MRN: [id number]  Patient presents to clinic today for 2 week follow-up for am abdominal cramping and loose stools.  Since initiation of fiber supplement and probiotic, patient states that her symptoms are much improved.  Still having some loose stools in the morning, but the frequency is much less.  Still having stress at work/home, but is trying to cope with it. Denies melena or hematochezia, fever, chills.  Denies constipation.  Has history of colon polyps diagnosed in 2008 and family history of CRC.  Was told in 2011 to repeat colonoscopy in 5 years.    Past Medical History  Diagnosis Date  . Migraines   . Chicken pox as a child    X 2  . Kidney stones 7-12    X 3  . Diverticulosis 2008  . Colon polyps 2008  . GERD (gastroesophageal reflux disease) 2008  . Erosive inflammation of stomach lining 2008  . Obesity 10/08/2011  . Preventative health care 10/13/2011  . Foot pain, right 10/13/2011  . Ovarian cyst, right   . Bronchitis 11/05/2011  . Left shoulder pain 01/04/2012  . Skin tag 06/01/2012  . Cough 06/01/2012  . Diverticulosis 03/25/2013  . Solitary pulmonary nodule 03/25/2013    Current Outpatient Prescriptions on File Prior to Visit  Medication Sig Dispense Refill  . omeprazole (PRILOSEC) 40 MG capsule Take 40 mg by mouth daily.      . rizatriptan (MAXALT) 10 MG tablet Take 1 tablet (10 mg total) by mouth as needed. May repeat in 2 hours if needed  18 tablet  1   No current facility-administered medications on file prior to visit.    No Known Allergies  Family History  Problem Relation Age of Onset  . Hypertension Mother   . Cancer Mother     skin, melanoma  . Migraines Mother   . Heart disease Maternal Grandmother   . Colon cancer Maternal Grandmother   . Colon cancer Maternal Grandfather   . Colon cancer Maternal Grandfather   . Bipolar disorder Daughter     History   Social History  . Marital  Status: Married    Spouse Name: Abigayle Wilinski    Number of Children: 3  . Years of Education: Assoc degr   Occupational History  . human resources     [employer]    Social History Main Topics  . Smoking status: Never Smoker   . Smokeless tobacco: Never Used  . Alcohol Use: 1.5 oz/week    3 drink(s) per week     Comment: occasionally  . Drug Use: No  . Sexual Activity: Yes    Partners: Male   Other Topics Concern  . None   Social History Narrative   Lives with her partner Avnoor Koury.           ROS See HPI   Filed Vitals:   07/25/13 1604  BP: 114/76  Pulse: 89  Temp: 98.2 F (36.8 C)  Resp: 16    Physical Exam  Constitutional: She is oriented to person, place, and time and well-developed, well-nourished, and in no distress.  HENT:  Head: Normocephalic and atraumatic.  Eyes: Conjunctivae are normal.  Neck: Neck supple.  Abdominal: Soft. Bowel sounds are normal.  Neurological: She is alert and oriented to person, place, and time.  Skin: Skin is warm and dry. No rash noted.  Psychiatric: Affect normal.   Recent Results (from the past  2160 hour(s))  CBC WITH DIFFERENTIAL     Status: None   Collection Time    07/12/13 10:15 AM      Result Value Range   WBC 6.1  4.0 - 10.5 K/uL   RBC 4.62  3.87 - 5.11 MIL/uL   Hemoglobin 12.6  12.0 - 15.0 g/dL   HCT 40.9  81.1 - 91.4 %   MCV 81.8  78.0 - 100.0 fL   MCH 27.3  26.0 - 34.0 pg   MCHC 33.3  30.0 - 36.0 g/dL   RDW 78.2  95.6 - 21.3 %   Platelets 313  150 - 400 K/uL   Neutrophils Relative % 44  43 - 77 %   Neutro Abs 2.8  1.7 - 7.7 K/uL   Lymphocytes Relative 38  12 - 46 %   Lymphs Abs 2.3  0.7 - 4.0 K/uL   Monocytes Relative 12  3 - 12 %   Monocytes Absolute 0.7  0.1 - 1.0 K/uL   Eosinophils Relative 5  0 - 5 %   Eosinophils Absolute 0.3  0.0 - 0.7 K/uL   Basophils Relative 1  0 - 1 %   Basophils Absolute 0.0  0.0 - 0.1 K/uL   Smear Review Criteria for review not met     Assessment/Plan: Irritable bowel  syndrome Continue fiber supplement and daily probiotic.  Weight loss and exercise to help with symptoms.  Follow-up with GI provider for need for repeat colonoscopy given family history.  Follow-up in 1 month

## 2013-07-25 NOTE — Assessment & Plan Note (Signed)
Continue fiber supplement and daily probiotic.  Weight loss and exercise to help with symptoms.  Follow-up with GI provider for need for repeat colonoscopy given family history.  Follow-up in 1 month

## 2013-07-25 NOTE — Patient Instructions (Signed)
Continue probiotic and daily fiber supplement.  Watch for foods that seem to trigger your symptoms.  Diet and exercise may be beneficial for symptoms and stress levels.  Follow-up in 1 month.  Irritable Bowel Syndrome Irritable Bowel Syndrome (IBS) is caused by a disturbance of normal bowel function. Other terms used are spastic colon, mucous colitis, and irritable colon. It does not require surgery, nor does it lead to cancer. There is no cure for IBS. But with proper diet, stress reduction, and medication, you will find that your problems (symptoms) will gradually disappear or improve. IBS is a common digestive disorder. It usually appears in late adolescence or early adulthood. Women develop it twice as often as men. CAUSES  After food has been digested and absorbed in the small intestine, waste material is moved into the colon (large intestine). In the colon, water and salts are absorbed from the undigested products coming from the small intestine. The remaining residue, or fecal material, is held for elimination. Under normal circumstances, gentle, rhythmic contractions on the bowel walls push the fecal material along the colon towards the rectum. In IBS, however, these contractions are irregular and poorly coordinated. The fecal material is either retained too long, resulting in constipation, or expelled too soon, producing diarrhea. SYMPTOMS  The most common symptom of IBS is pain. It is typically in the lower left side of the belly (abdomen). But it may occur anywhere in the abdomen. It can be felt as heartburn, backache, or even as a dull pain in the arms or shoulders. The pain comes from excessive bowel-muscle spasms and from the buildup of gas and fecal material in the colon. This pain:  Can range from sharp belly (abdominal) cramps to a dull, continuous ache.  Usually worsens soon after eating.  Is typically relieved by having a bowel movement or passing gas. Abdominal pain is usually  accompanied by constipation. But it may also produce diarrhea. The diarrhea typically occurs right after a meal or upon arising in the morning. The stools are typically soft and watery. They are often flecked with secretions (mucus). Other symptoms of IBS include:  Bloating.  Loss of appetite.  Heartburn.  Feeling sick to your stomach (nausea).  Belching  Vomiting  Gas. IBS may also cause a number of symptoms that are unrelated to the digestive system:  Fatigue.  Headaches.  Anxiety  Shortness of breath  Difficulty in concentrating.  Dizziness. These symptoms tend to come and go. DIAGNOSIS  The symptoms of IBS closely mimic the symptoms of other, more serious digestive disorders. So your caregiver may wish to perform a variety of additional tests to exclude these disorders. He/she wants to be certain of learning what is wrong (diagnosis). The nature and purpose of each test will be explained to you. TREATMENT A number of medications are available to help correct bowel function and/or relieve bowel spasms and abdominal pain. Among the drugs available are:  Mild, non-irritating laxatives for severe constipation and to help restore normal bowel habits.  Specific anti-diarrheal medications to treat severe or prolonged diarrhea.  Anti-spasmodic agents to relieve intestinal cramps.  Your caregiver may also decide to treat you with a mild tranquilizer or sedative during unusually stressful periods in your life. The important thing to remember is that if any drug is prescribed for you, make sure that you take it exactly as directed. Make sure that your caregiver knows how well it worked for you. HOME CARE INSTRUCTIONS   Avoid foods that are high  in fat or oils. Some examples [id number] cream, butter, frankfurters, sausage, and other fatty meats.  Avoid foods that have a laxative effect, such as fruit, fruit juice, and dairy products.  Cut out carbonated drinks, chewing gum,  and "gassy" foods, such as beans and cabbage. This may help relieve bloating and belching.  Bran taken with plenty of liquids may help relieve constipation.  Keep track of what foods seem to trigger your symptoms.  Avoid emotionally charged situations or circumstances that produce anxiety.  Start or continue exercising.  Get plenty of rest and sleep. MAKE SURE YOU:   Understand these instructions.  Will watch your condition.  Will get help right away if you are not doing well or get worse. Document Released: 10/20/2005 Document Revised: 01/12/2012 Document Reviewed: 06/09/2008 [facility] Patient Information 2014 [place], [place].

## 2013-11-02 ENCOUNTER — Emergency Department (HOSPITAL_BASED_OUTPATIENT_CLINIC_OR_DEPARTMENT_OTHER)
Admission: EM | Admit: 2013-11-02 | Discharge: 2013-11-02 | Disposition: A | Discharge status: Home / Self Care | Payer: Federal, State, Local not specified - PPO | Attending: Emergency Medicine | Admitting: Emergency Medicine

## 2013-11-02 ENCOUNTER — Emergency Department (HOSPITAL_BASED_OUTPATIENT_CLINIC_OR_DEPARTMENT_OTHER): Payer: Federal, State, Local not specified - PPO

## 2013-11-02 ENCOUNTER — Encounter (HOSPITAL_BASED_OUTPATIENT_CLINIC_OR_DEPARTMENT_OTHER): Payer: Self-pay | Admitting: Emergency Medicine

## 2013-11-02 ENCOUNTER — Telehealth: Payer: Self-pay | Admitting: Family Medicine

## 2013-11-02 DIAGNOSIS — Z8619 Personal history of other infectious and parasitic diseases: Secondary | ICD-10-CM | POA: Insufficient documentation

## 2013-11-02 DIAGNOSIS — K219 Gastro-esophageal reflux disease without esophagitis: Secondary | ICD-10-CM | POA: Insufficient documentation

## 2013-11-02 DIAGNOSIS — Z860100 Personal history of colon polyps, unspecified: Secondary | ICD-10-CM | POA: Insufficient documentation

## 2013-11-02 DIAGNOSIS — E669 Obesity, unspecified: Secondary | ICD-10-CM | POA: Insufficient documentation

## 2013-11-02 DIAGNOSIS — Z872 Personal history of diseases of the skin and subcutaneous tissue: Secondary | ICD-10-CM | POA: Insufficient documentation

## 2013-11-02 DIAGNOSIS — Z8742 Personal history of other diseases of the female genital tract: Secondary | ICD-10-CM | POA: Insufficient documentation

## 2013-11-02 DIAGNOSIS — Z79899 Other long term (current) drug therapy: Secondary | ICD-10-CM | POA: Insufficient documentation

## 2013-11-02 DIAGNOSIS — G43109 Migraine with aura, not intractable, without status migrainosus: Secondary | ICD-10-CM | POA: Insufficient documentation

## 2013-11-02 DIAGNOSIS — Z8709 Personal history of other diseases of the respiratory system: Secondary | ICD-10-CM | POA: Insufficient documentation

## 2013-11-02 DIAGNOSIS — Z87442 Personal history of urinary calculi: Secondary | ICD-10-CM | POA: Insufficient documentation

## 2013-11-02 MED ORDER — DIPHENHYDRAMINE HCL 50 MG/ML IJ SOLN
25.0000 mg | Freq: Once | INTRAMUSCULAR | Status: AC
  Administered 2013-11-02: 25 mg via INTRAVENOUS
  Filled 2013-11-02: qty 1

## 2013-11-02 MED ORDER — SODIUM CHLORIDE 0.9 % IV BOLUS (SEPSIS)
1000.0000 mL | Freq: Once | INTRAVENOUS | Status: AC
  Administered 2013-11-02: 1000 mL via INTRAVENOUS

## 2013-11-02 MED ORDER — DEXAMETHASONE SODIUM PHOSPHATE 10 MG/ML IJ SOLN
10.0000 mg | Freq: Once | INTRAMUSCULAR | Status: AC
  Administered 2013-11-02: 10 mg via INTRAVENOUS
  Filled 2013-11-02: qty 1

## 2013-11-02 MED ORDER — METOCLOPRAMIDE HCL 5 MG/ML IJ SOLN
10.0000 mg | Freq: Once | INTRAMUSCULAR | Status: AC
  Administered 2013-11-02: 10 mg via INTRAVENOUS
  Filled 2013-11-02: qty 2

## 2013-11-02 NOTE — ED Provider Notes (Signed)
CSN: [id number]     Arrival date & time 11/02/13  1309 History   First MD Initiated Contact with Patient 11/02/13 1354     Chief Complaint  Patient presents with  . Eye Problem   (Consider location/radiation/quality/duration/timing/severity/associated sxs/prior Treatment) Patient is a 42 y.o. female presenting with migraines and eye problem. The history is provided by the patient. No language interpreter was used.  Migraine This is a recurrent problem. The current episode started 3 to 5 hours ago. The problem occurs constantly. The problem has not changed since onset.Associated symptoms include headaches. Pertinent negatives include no chest pain, no abdominal pain and no shortness of breath. Exacerbated by: light, noise. Nothing relieves the symptoms. Treatments tried: maxalt. The treatment provided no relief.  Eye Problem Location:  Both Quality: intermittent, fluctuating visual changes. Severity:  Moderate Onset quality:  Sudden Duration:  1 week Timing:  Intermittent Progression:  Resolved (had been waxing & waning, now resolved) Chronicity:  New Relieved by:  Nothing Worsened by:  Nothing tried Ineffective treatments:  None tried Associated symptoms: blurred vision, decreased vision, headaches, photophobia and scotomas   Associated symptoms: no discharge, no double vision, no foreign body sensation, no itching, no nausea, no numbness, no redness, no swelling, no tearing, no tingling, no vomiting and no weakness   Headaches:    Severity:  Moderate   Duration:  1 week   Timing:  Intermittent   Progression:  Worsening (per pt, has now turined into a migraine)   Chronicity:  Recurrent   Past Medical History  Diagnosis Date  . Migraines   . Chicken pox as a child    X 2  . Kidney stones 7-12    X 3  . Diverticulosis 2008  . Colon polyps 2008  . GERD (gastroesophageal reflux disease) 2008  . Erosive inflammation of stomach lining 2008  . Obesity 10/08/2011  . Preventative  health care 10/13/2011  . Foot pain, right 10/13/2011  . Ovarian cyst, right   . Bronchitis 11/05/2011  . Left shoulder pain 01/04/2012  . Skin tag 06/01/2012  . Cough 06/01/2012  . Diverticulosis 03/25/2013  . Solitary pulmonary nodule 03/25/2013   Past Surgical History  Procedure Laterality Date  . Lithotripsy  2008  . Tonsilectomy, adenoidectomy, bilateral myringotomy and tubes      Age 48  . Stent for kidney stone  7-12  . Ovarian cyst removal     Family History  Problem Relation Age of Onset  . Hypertension Mother   . Cancer Mother     skin, melanoma  . Migraines Mother   . Heart disease Maternal Grandmother   . Colon cancer Maternal Grandmother   . Colon cancer Maternal Grandfather   . Colon cancer Maternal Grandfather   . Bipolar disorder Daughter    History  Substance Use Topics  . Smoking status: Never Smoker   . Smokeless tobacco: Never Used  . Alcohol Use: 1.5 oz/week    3 drink(s) per week     Comment: occasionally   OB History   Grav Para Term Preterm Abortions TAB SAB Ect Mult Living                 Review of Systems  Constitutional: Negative for fever, chills, diaphoresis, activity change, appetite change and fatigue.  HENT: Negative for congestion, facial swelling, rhinorrhea and sore throat.   Eyes: Positive for blurred vision, photophobia and visual disturbance. Negative for double vision, discharge, redness and itching.  Respiratory: Negative for cough,  chest tightness and shortness of breath.   Cardiovascular: Negative for chest pain, palpitations and leg swelling.  Gastrointestinal: Negative for nausea, vomiting, abdominal pain and diarrhea.  Endocrine: Negative for polydipsia and polyuria.  Genitourinary: Negative for dysuria, frequency, difficulty urinating and pelvic pain.  Musculoskeletal: Negative for arthralgias, back pain, neck pain and neck stiffness.  Skin: Negative for color change and wound.  Allergic/Immunologic: Negative for  immunocompromised state.  Neurological: Positive for headaches. Negative for tingling, facial asymmetry, weakness and numbness.  Hematological: Does not bruise/bleed easily.  Psychiatric/Behavioral: Negative for confusion and agitation.    Allergies  Review of patient's allergies indicates no known allergies.  Home Medications   Current Outpatient Rx  Name  Route  Sig  Dispense  Refill  . omeprazole (PRILOSEC) 40 MG capsule   Oral   Take 40 mg by mouth daily.         . Probiotic Product (PROBIOTIC DAILY PO)   Oral   Take by mouth daily.         . rizatriptan (MAXALT) 10 MG tablet   Oral   Take 1 tablet (10 mg total) by mouth as needed. May repeat in 2 hours if needed   18 tablet   1    BP 127/76  Pulse 78  Temp(Src) 97.5 F (36.4 C) (Oral)  Resp 16  Ht 5\' 2"  (1.575 m)  Wt 198 lb (89.812 kg)  BMI 36.21 kg/m2  SpO2 100%  LMP 10/02/2013 Physical Exam  Constitutional: She is oriented to person, place, and time. She appears well-developed and well-nourished. No distress.  HENT:  Head: Normocephalic and atraumatic.  Mouth/Throat: No oropharyngeal exudate.  Eyes: Conjunctivae and EOM are normal. Pupils are equal, round, and reactive to light.  Fundoscopic exam:      The right eye shows no hemorrhage and no papilledema.       The left eye shows no hemorrhage and no papilledema.  Neck: Normal range of motion. Neck supple.  Cardiovascular: Normal rate, regular rhythm and normal heart sounds.  Exam reveals no gallop and no friction rub.   No murmur heard. Pulmonary/Chest: Effort normal and breath sounds normal. No respiratory distress. She has no wheezes. She has no rales.  Abdominal: Soft. Bowel sounds are normal. She exhibits no distension and no mass. There is no tenderness. There is no rebound and no guarding.  Musculoskeletal: Normal range of motion. She exhibits no edema and no tenderness.  Neurological: She is alert and oriented to person, place, and time.  Skin:  Skin is warm and dry.  Psychiatric: She has a normal mood and affect.    ED Course  Procedures (including critical care time) Labs Review Labs Reviewed - No data to display Imaging Review Ct Head Wo Contrast  11/02/2013   CLINICAL DATA:  Headache.  Visual changes.  EXAM: CT HEAD WITHOUT CONTRAST  TECHNIQUE: Contiguous axial images were obtained from the base of the skull through the vertex without intravenous contrast.  COMPARISON:  None.  FINDINGS: The brainstem, cerebellum, cerebral peduncles, thalamus, basal ganglia, basilar cisterns, and ventricular system appear within normal limits. No intracranial hemorrhage, mass lesion, or acute CVA.  IMPRESSION: No significant abnormality identified.   Electronically Signed   By: Herbie Baltimore M.D.   On: 11/02/2013 15:17    EKG Interpretation   None       MDM   1. Migraine with aura    Pt is a 42 y.o. female with Pmhx as above who presents with  about 1 week of intermittent BL visual changes described as seeing prisim-like light, orbs, floaters, blurry vision, today w/ lattice like visual change followed by onset of migraine h/a. Pt states she has had dull frontal h/a for 1 week, but didn't have migraine h/a today. Visual changes now resolved.  No n/v, d/a, fever, neck pain/stiffness, numbness, weakness.  On PE, VSS, pt uncomfortable, but in NAD.  No focal neuro findings on PE, VA 20/20 BL.  No fundoscopic findings. CT head unremarkable. Pt feeling much improved after migraine cocktail. No return of visual symptoms. Doubt CVA, TIA, SAH, glaucoma and feel she likely has migraine w/ visual aura.  Will rec outpt neurology f/u, PCP f/u.  Return precautions given for new or worsening symptoms including focal neuro symptoms, h/a, neck pain.          Shanna Cisco, MD 11/02/13 [phone]

## 2013-11-02 NOTE — Telephone Encounter (Signed)
Patient Information:  Caller Name: Erinn  Phone: [phone]  Patient: Lisa Ray, Lisa Ray  Gender: Female  DOB: [birth year]  Age: 42 Years  PCP: Danise [facility])  Pregnant: No  Office Follow Up:  Does the office need to follow up with this patient?: No  Instructions For The Office: N/A  RN Note:  Patient states she developed a dull frontal headache, onset 10/26/13. States headache is intermittent but frequent. Denies nausea or vomiting. No nuccal rigidity. Patient states she has had "rainbow prism type things" in both eyes, onset 10/26/13. States, " almost like U shaped." States she saw constant "fuzzy black and white" when she closes her eyes." States she sees "orbs or flecks floating" in her vision at frequent intervals. States headache is at a "6" on 1-10 scale. Patient complains of feeling lightheaded and dizzy at frequent intervals. Denies eye pain. Patient describes vision as distorted vision, " a lattice looking pattern."  Triage per Vision Change Protocol. Emergent Disposition obtained related to  positive triage assessment for " Sudden change in central vision; distortion of objects and not previously evaluated." Care advice given per guidelines. Patient advised to be seen in the ED now. Patient advised not to drive self. Patient advised to change positions slowly, with assistance. Patient verbalizes understanding and agreeable. Patient states she will go to the ED at [facility] for evaluation.  Symptoms  Reason For Call & Symptoms: Headache, vision problems  Reviewed Health History In EMR: Yes  Reviewed Medications In EMR: Yes  Reviewed Allergies In EMR: Yes  Reviewed Surgeries / Procedures: Yes  Date of Onset of Symptoms: 10/26/2013  Treatments Tried: Aleve  Treatments Tried Worked: No OB / GYN:  LMP: 10/02/2013  Guideline(s) Used:  Headache  Disposition Per Guideline:   Go to ED Now (or to Office with PCP Approval)  Reason For  Disposition Reached:   Patient sounds very sick or weak to the triager  Advice Given:  Rest:   Lie down in a dark, quiet place and try to relax. Close your eyes and imagine your entire body relaxing.  Apply Cold to the Area:   Apply a cold wet washcloth or cold pack to the forehead for 20 minutes.  Call Back If:  You become worse.  RN Overrode Recommendation:  Go To ED  Nursing Judgement

## 2013-11-02 NOTE — ED Notes (Signed)
Pt c/p vision change with h/a x 1 week , sent here by PMD for eval

## 2013-11-02 NOTE — Telephone Encounter (Signed)
FYI

## 2013-11-25 ENCOUNTER — Ambulatory Visit (INDEPENDENT_AMBULATORY_CARE_PROVIDER_SITE_OTHER): Payer: Federal, State, Local not specified - PPO | Admitting: Family Medicine

## 2013-11-25 ENCOUNTER — Encounter: Payer: Self-pay | Admitting: Family Medicine

## 2013-11-25 VITALS — BP 130/92 | HR 76 | Temp 97.6°F | Ht 62.0 in | Wt 197.1 lb

## 2013-11-25 DIAGNOSIS — G43909 Migraine, unspecified, not intractable, without status migrainosus: Secondary | ICD-10-CM

## 2013-11-25 MED ORDER — ELETRIPTAN HYDROBROMIDE 40 MG PO TABS: 40.0000 mg | ORAL_TABLET | ORAL | Status: DC | PRN

## 2013-11-25 MED ORDER — PROMETHAZINE HCL 25 MG PO TABS: 25.0000 mg | ORAL_TABLET | Freq: Three times a day (TID) | ORAL | Status: DC | PRN

## 2013-11-25 NOTE — Patient Instructions (Signed)
Migraine Headache A migraine headache is an intense, throbbing pain on one or both sides of your head. A migraine can last for 30 minutes to several hours. CAUSES  The exact cause of a migraine headache is not always known. However, a migraine may be caused when nerves in the brain become irritated and release chemicals that cause inflammation. This causes pain. Certain things may also trigger migraines, such as:  Alcohol.  Smoking.  Stress.  Menstruation.  Aged cheeses.  Foods or drinks that contain nitrates, glutamate, aspartame, or tyramine.  Lack of sleep.  Chocolate.  Caffeine.  Hunger.  Physical exertion.  Fatigue.  Medicines used to treat chest pain (nitroglycerine), birth control pills, estrogen, and some blood pressure medicines. SIGNS AND SYMPTOMS  Pain on one or both sides of your head.  Pulsating or throbbing pain.  Severe pain that prevents daily activities.  Pain that is aggravated by any physical activity.  Nausea, vomiting, or both.  Dizziness.  Pain with exposure to bright lights, loud noises, or activity.  General sensitivity to bright lights, loud noises, or smells. Before you get a migraine, you may get warning signs that a migraine is coming (aura). An aura may include:  Seeing flashing lights.  Seeing bright spots, halos, or zig-zag lines.  Having tunnel vision or blurred vision.  Having feelings of numbness or tingling.  Having trouble talking.  Having muscle weakness. DIAGNOSIS  A migraine headache is often diagnosed based on:  Symptoms.  Physical exam.  A CT scan or MRI of your head. These imaging tests cannot diagnose migraines, but they can help rule out other causes of headaches. TREATMENT Medicines may be given for pain and nausea. Medicines can also be given to help prevent recurrent migraines.  HOME CARE INSTRUCTIONS  Only take over-the-counter or prescription medicines for pain or discomfort as directed by your  health care provider. The use of long-term narcotics is not recommended.  Lie down in a dark, quiet room when you have a migraine.  Keep a journal to find out what may trigger your migraine headaches. For example, write down:  What you eat and drink.  How much sleep you get.  Any change to your diet or medicines.  Limit alcohol consumption.  Quit smoking if you smoke.  Get 7 9 hours of sleep, or as recommended by your health care provider.  Limit stress.  Keep lights dim if bright lights bother you and make your migraines worse. SEEK IMMEDIATE MEDICAL CARE IF:   Your migraine becomes severe.  You have a fever.  You have a stiff neck.  You have vision loss.  You have muscular weakness or loss of muscle control.  You start losing your balance or have trouble walking.  You feel faint or pass out.  You have severe symptoms that are different from your first symptoms. MAKE SURE YOU:   Understand these instructions.  Will watch your condition.  Will get help right away if you are not doing well or get worse. Document Released: 10/20/2005 Document Revised: 08/10/2013 Document Reviewed: 06/27/2013 Exit[employer] Patient Information 2014 [employer].  

## 2013-11-25 NOTE — Progress Notes (Signed)
Pre visit review using our clinic review tool, if applicable. No additional management support is needed unless otherwise documented below in the visit note. 

## 2013-11-28 ENCOUNTER — Encounter: Payer: Self-pay | Admitting: Family Medicine

## 2013-11-28 NOTE — Assessment & Plan Note (Signed)
Ran out of Maxalt and has had a migraine for the past couple of days. Given rx for Relpax to try, encouraged NSAIDs as well. Needs adequate hydration, no skippng meals and regular exercise

## 2013-11-28 NOTE — Progress Notes (Signed)
Patient ID: Lisa MohairElizabeth A Ray, female   DOB: 11/21/1970, 43 y.o.   MRN: [id number] Lisa Mohairlizabeth A Ray [id number] 10/13/1971 11/28/2013      Progress Note-Follow Up  Subjective  Chief Complaint  Chief Complaint  Patient presents with  . Migraine    HPI  Patient is a 43 yo female in today for migraine. Is out of her Maxalt which does work but caused some flushing, she is experiencing some mild nausea and is not having Photophobia or phonophobia now has had some in past. No congestion fevers, cp, palp, sob, gi or gu c/o.  Past Medical History  Diagnosis Date  . Migraines   . Chicken pox as a child    X 2  . Kidney stones 7-12    X 3  . Diverticulosis 2008  . Colon polyps 2008  . GERD (gastroesophageal reflux disease) 2008  . Erosive inflammation of stomach lining 2008  . Obesity 10/08/2011  . Preventative health care 10/13/2011  . Foot pain, right 10/13/2011  . Ovarian cyst, right   . Bronchitis 11/05/2011  . Left shoulder pain 01/04/2012  . Skin tag 06/01/2012  . Cough 06/01/2012  . Diverticulosis 03/25/2013  . Solitary pulmonary nodule 03/25/2013    Past Surgical History  Procedure Laterality Date  . Lithotripsy  2008  . Tonsilectomy, adenoidectomy, bilateral myringotomy and tubes      Age 43  . Stent for kidney stone  7-12  . Ovarian cyst removal      Family History  Problem Relation Age of Onset  . Hypertension Mother   . Cancer Mother     skin, melanoma  . Migraines Mother   . Heart disease Maternal Grandmother   . Colon cancer Maternal Grandmother   . Colon cancer Maternal Grandfather   . Colon cancer Maternal Grandfather   . Bipolar disorder Daughter     History   Social History  . Marital Status: Married    Spouse Name: Eduard ClosRuben Cowden    Number of Children: 3  . Years of Education: Assoc degr   Occupational History  . human resources     [employer]    Social History Main Topics  . Smoking status: Never Smoker   . Smokeless tobacco: Never Used  . Alcohol  Use: 1.5 oz/week    3 drink(s) per week     Comment: occasionally  . Drug Use: No  . Sexual Activity: Yes    Partners: Male   Other Topics Concern  . Not on file   Social History Narrative   Lives with her partner Eduard Closruben Laubach.           Current Outpatient Prescriptions on File Prior to Visit  Medication Sig Dispense Refill  . omeprazole (PRILOSEC) 40 MG capsule Take 40 mg by mouth daily.      . Probiotic Product (PROBIOTIC DAILY PO) Take by mouth daily.       No current facility-administered medications on file prior to visit.    No Known Allergies  Review of Systems  Review of Systems  Constitutional: Negative for fever and malaise/fatigue.  HENT: Negative for congestion.   Eyes: Negative for discharge.  Respiratory: Negative for shortness of breath.   Cardiovascular: Negative for chest pain, palpitations and leg swelling.  Gastrointestinal: Negative for nausea, abdominal pain and diarrhea.  Genitourinary: Negative for dysuria.  Musculoskeletal: Negative for falls.  Skin: Negative for rash.  Neurological: Negative for loss of consciousness and headaches.  Endo/Heme/Allergies: Negative for polydipsia.  Psychiatric/Behavioral:  Negative for depression and suicidal ideas. The patient is not nervous/anxious and does not have insomnia.     Objective  BP 130/92  Pulse 76  Temp(Src) 97.6 F (36.4 C) (Oral)  Ht 5\' 2"  (1.575 m)  Wt 197 lb 1.9 oz (89.413 kg)  BMI 36.04 kg/m2  SpO2 97%  LMP 11/25/2013  Physical Exam  Physical Exam  Constitutional: She is oriented to person, place, and time and well-developed, well-nourished, and in no distress. No distress.  HENT:  Head: Normocephalic and atraumatic.  Eyes: Conjunctivae are normal.  Neck: Neck supple. No thyromegaly present.  Cardiovascular: Normal rate, regular rhythm and normal heart sounds.   No murmur heard. Pulmonary/Chest: Effort normal and breath sounds normal. She has no wheezes.  Abdominal: She  exhibits no distension and no mass.  Musculoskeletal: She exhibits no edema.  Lymphadenopathy:    She has no cervical adenopathy.  Neurological: She is alert and oriented to person, place, and time.  Skin: Skin is warm and dry. No rash noted. She is not diaphoretic.  Psychiatric: Memory, affect and judgment normal.    Lab Results  Component Value Date   TSH 2.366 10/08/2011   Lab Results  Component Value Date   WBC 6.1 07/12/2013   HGB 12.6 07/12/2013   HCT 37.8 07/12/2013   MCV 81.8 07/12/2013   PLT 313 07/12/2013   Lab Results  Component Value Date   CREATININE 0.77 10/08/2011   BUN 10 10/08/2011   NA 135 10/08/2011   K 4.4 10/08/2011   CL 103 10/08/2011   CO2 25 10/08/2011   Lab Results  Component Value Date   ALT 11 10/08/2011   AST 17 10/08/2011   ALKPHOS 76 10/08/2011   BILITOT 0.4 10/08/2011   Lab Results  Component Value Date   CHOL 184 10/08/2011   Lab Results  Component Value Date   HDL 49 10/08/2011   Lab Results  Component Value Date   LDLCALC 105* 10/08/2011   Lab Results  Component Value Date   TRIG 152* 10/08/2011   Lab Results  Component Value Date   CHOLHDL 3.8 10/08/2011     Assessment & Plan  Migraines Ran out of Maxalt and has had a migraine for the past couple of days. Given rx for Relpax to try, encouraged NSAIDs as well. Needs adequate hydration, no skippng meals and regular exercise

## 2013-12-28 ENCOUNTER — Encounter: Payer: Self-pay | Admitting: Family Medicine

## 2013-12-30 ENCOUNTER — Other Ambulatory Visit: Payer: Self-pay | Admitting: Family Medicine

## 2013-12-30 DIAGNOSIS — G43909 Migraine, unspecified, not intractable, without status migrainosus: Secondary | ICD-10-CM

## 2013-12-30 MED ORDER — RIZATRIPTAN BENZOATE 10 MG PO TABS: 10.0000 mg | ORAL_TABLET | ORAL | Status: DC | PRN

## 2014-01-03 ENCOUNTER — Encounter: Payer: Self-pay | Admitting: Family Medicine

## 2014-01-03 ENCOUNTER — Other Ambulatory Visit (HOSPITAL_COMMUNITY)
Admission: RE | Admit: 2014-01-03 | Discharge: 2014-01-03 | Disposition: A | Discharge status: Home / Self Care | Payer: Federal, State, Local not specified - PPO | Source: Ambulatory Visit | Attending: Family Medicine | Admitting: Family Medicine

## 2014-01-03 ENCOUNTER — Ambulatory Visit (INDEPENDENT_AMBULATORY_CARE_PROVIDER_SITE_OTHER): Payer: Federal, State, Local not specified - PPO | Admitting: Family Medicine

## 2014-01-03 VITALS — BP 122/78 | HR 75 | Temp 98.0°F | Ht 62.0 in | Wt 205.0 lb

## 2014-01-03 DIAGNOSIS — Z124 Encounter for screening for malignant neoplasm of cervix: Secondary | ICD-10-CM | POA: Insufficient documentation

## 2014-01-03 DIAGNOSIS — K649 Unspecified hemorrhoids: Secondary | ICD-10-CM

## 2014-01-03 DIAGNOSIS — Z01419 Encounter for gynecological examination (general) (routine) without abnormal findings: Secondary | ICD-10-CM | POA: Insufficient documentation

## 2014-01-03 DIAGNOSIS — Z800 Family history of malignant neoplasm of digestive organs: Secondary | ICD-10-CM

## 2014-01-03 DIAGNOSIS — N926 Irregular menstruation, unspecified: Secondary | ICD-10-CM

## 2014-01-03 DIAGNOSIS — E785 Hyperlipidemia, unspecified: Secondary | ICD-10-CM

## 2014-01-03 DIAGNOSIS — Z0000 Encounter for general adult medical examination without abnormal findings: Secondary | ICD-10-CM

## 2014-01-03 DIAGNOSIS — G44229 Chronic tension-type headache, not intractable: Secondary | ICD-10-CM

## 2014-01-03 MED ORDER — HYDROCORTISONE ACETATE 25 MG RE SUPP: 25.0000 mg | Freq: Two times a day (BID) | RECTAL | Status: DC

## 2014-01-03 MED ORDER — DESOGESTREL-ETHINYL ESTRADIOL 0.15-0.02/0.01 MG (21/5) PO TABS: 1.0000 | ORAL_TABLET | Freq: Every day | ORAL | Status: DC

## 2014-01-03 NOTE — Assessment & Plan Note (Signed)
Pap today. Start Kariva for irregular menses

## 2014-01-03 NOTE — Progress Notes (Signed)
Pre visit review using our clinic review tool, if applicable. No additional management support is needed unless otherwise documented below in the visit note. 

## 2014-01-03 NOTE — Patient Instructions (Signed)

## 2014-01-08 DIAGNOSIS — E782 Mixed hyperlipidemia: Secondary | ICD-10-CM | POA: Insufficient documentation

## 2014-01-08 DIAGNOSIS — Z800 Family history of malignant neoplasm of digestive organs: Secondary | ICD-10-CM | POA: Insufficient documentation

## 2014-01-08 DIAGNOSIS — N926 Irregular menstruation, unspecified: Secondary | ICD-10-CM | POA: Insufficient documentation

## 2014-01-08 NOTE — Assessment & Plan Note (Addendum)
Pap today, encouraged 3d mgm. Referred for GI evaluation. Encouraged heart healthy diet and regular exercise.

## 2014-01-08 NOTE — Assessment & Plan Note (Signed)
Referred for colonoscopy due to FH of colon cancer and recent rectal bleeding.

## 2014-01-08 NOTE — Assessment & Plan Note (Signed)
Try Garnette ScheuermannKariva daily start on next Sunday after next cycle starts

## 2014-01-08 NOTE — Progress Notes (Signed)
Patient ID: Lisa Ray, female   DOB: 1971-06-08, 43 y.o.   MRN: [id number] Lisa Ray [id number] 1971-11-03 01/08/2014      Progress Note-Follow Up  Subjective  Chief Complaint  Chief Complaint  Patient presents with  . Gynecologic Exam    pap  . Follow-up    HPI  Patient is a 43 year old Caucasian female in today for annual exam. She is generally feeling well but she is acknowledging some recent hemorrhoidal bleeding. She has some bright red blood on the tissue at times. Otherwise no abdominal pain, nausea, vomiting, anorexia. No fevers or chills. No chest pain, palpitations or shortness of breath. Taking medications as directed.  Past Medical History  Diagnosis Date  . Migraines   . Chicken pox as a child    X 2  . Kidney stones 7-12    X 3  . Diverticulosis 2008  . Colon polyps 2008  . GERD (gastroesophageal reflux disease) 2008  . Erosive inflammation of stomach lining 2008  . Obesity 10/08/2011  . Preventative health care 10/13/2011  . Foot pain, right 10/13/2011  . Ovarian cyst, right   . Bronchitis 11/05/2011  . Left shoulder pain 01/04/2012  . Skin tag 06/01/2012  . Cough 06/01/2012  . Diverticulosis 03/25/2013  . Solitary pulmonary nodule 03/25/2013    Past Surgical History  Procedure Laterality Date  . Lithotripsy  2008  . Tonsilectomy, adenoidectomy, bilateral myringotomy and tubes      Age 43  . Stent for kidney stone  7-12  . Ovarian cyst removal      Family History  Problem Relation Age of Onset  . Hypertension Mother   . Cancer Mother     skin, melanoma  . Migraines Mother   . Heart disease Maternal Grandmother   . Colon cancer Maternal Grandmother   . Colon cancer Maternal Grandfather   . Colon cancer Maternal Grandfather   . Bipolar disorder Daughter     History   Social History  . Marital Status: Married    Spouse Name: Lasundra Hascall    Number of Children: 3  . Years of Education: Assoc degr   Occupational History  . human  resources     [employer]    Social History Main Topics  . Smoking status: Never Smoker   . Smokeless tobacco: Never Used  . Alcohol Use: 1.5 oz/week    3 drink(s) per week     Comment: occasionally  . Drug Use: No  . Sexual Activity: Yes    Partners: Male   Other Topics Concern  . Not on file   Social History Narrative   Lives with her partner Tehillah Cipriani.           Current Outpatient Prescriptions on File Prior to Visit  Medication Sig Dispense Refill  . omeprazole (PRILOSEC) 40 MG capsule Take 40 mg by mouth daily.      . Probiotic Product (PROBIOTIC DAILY PO) Take by mouth daily.      . promethazine (PHENERGAN) 25 MG tablet Take 1 tablet (25 mg total) by mouth every 8 (eight) hours as needed for nausea or vomiting.  20 tablet  0  . rizatriptan (MAXALT) 10 MG tablet Take 1 tablet (10 mg total) by mouth as needed. May repeat in 2 hours if needed  18 tablet  1   No current facility-administered medications on file prior to visit.    No Known Allergies  Review of Systems  Review of Systems  Constitutional:  Negative for fever, chills and malaise/fatigue.  HENT: Negative for congestion, hearing loss and nosebleeds.   Eyes: Negative for discharge.  Respiratory: Negative for cough, sputum production, shortness of breath and wheezing.   Cardiovascular: Negative for chest pain, palpitations and leg swelling.  Gastrointestinal: Negative for heartburn, nausea, vomiting, abdominal pain, diarrhea, constipation and blood in stool.  Genitourinary: Negative for dysuria, urgency, frequency and hematuria.  Musculoskeletal: Negative for back pain, falls and myalgias.  Skin: Negative for rash.  Neurological: Negative for dizziness, tremors, sensory change, focal weakness, loss of consciousness, weakness and headaches.  Endo/Heme/Allergies: Negative for polydipsia. Does not bruise/bleed easily.  Psychiatric/Behavioral: Negative for depression and suicidal ideas. The patient is not  nervous/anxious and does not have insomnia.     Objective  BP 122/78  Pulse 75  Temp(Src) 98 F (36.7 C) (Oral)  Ht 5\' 2"  (1.575 m)  Wt 205 lb 0.6 oz (93.006 kg)  BMI 37.49 kg/m2  SpO2 97%  LMP 12/26/2013  Physical Exam  Physical Exam  Constitutional: She is oriented to person, place, and time and well-developed, well-nourished, and in no distress. No distress.  HENT:  Head: Normocephalic and atraumatic.  Right Ear: External ear normal.  Left Ear: External ear normal.  Nose: Nose normal.  Mouth/Throat: Oropharynx is clear and moist. No oropharyngeal exudate.  Eyes: Conjunctivae are normal. Pupils are equal, round, and reactive to light. Right eye exhibits no discharge. Left eye exhibits no discharge. No scleral icterus.  Neck: Normal range of motion. Neck supple. No thyromegaly present.  Cardiovascular: Normal rate, regular rhythm, normal heart sounds and intact distal pulses.   No murmur heard. Pulmonary/Chest: Effort normal and breath sounds normal. No respiratory distress. She has no wheezes. She has no rales.  Abdominal: Soft. Bowel sounds are normal. She exhibits no distension and no mass. There is no tenderness.  Genitourinary: Vagina normal, uterus normal, cervix normal, right adnexa normal and left adnexa normal. No vaginal discharge found.  Breast exam unremarkable b/l no masses, skin changes or discharge  Musculoskeletal: Normal range of motion. She exhibits no edema and no tenderness.  Lymphadenopathy:    She has no cervical adenopathy.  Neurological: She is alert and oriented to person, place, and time. She has normal reflexes. No cranial nerve deficit. Coordination normal.  Skin: Skin is warm and dry. No rash noted. She is not diaphoretic.  Psychiatric: Mood, memory and affect normal.    Lab Results  Component Value Date   TSH 2.366 10/08/2011   Lab Results  Component Value Date   WBC 6.1 07/12/2013   HGB 12.6 07/12/2013   HCT 37.8 07/12/2013   MCV 81.8  07/12/2013   PLT 313 07/12/2013   Lab Results  Component Value Date   CREATININE 0.77 10/08/2011   BUN 10 10/08/2011   NA 135 10/08/2011   K 4.4 10/08/2011   CL 103 10/08/2011   CO2 25 10/08/2011   Lab Results  Component Value Date   ALT 11 10/08/2011   AST 17 10/08/2011   ALKPHOS 76 10/08/2011   BILITOT 0.4 10/08/2011   Lab Results  Component Value Date   CHOL 184 10/08/2011   Lab Results  Component Value Date   HDL 49 10/08/2011   Lab Results  Component Value Date   LDLCALC 105* 10/08/2011   Lab Results  Component Value Date   TRIG 152* 10/08/2011   Lab Results  Component Value Date   CHOLHDL 3.8 10/08/2011     Assessment & Plan  Cervical cancer screening Pap today. Start Kariva for irregular menses  Other and unspecified hyperlipidemia Mild, avoid trans fats, increase exercise  Preventative health care Pap today, encouraged 3d mgm. Referred for GI evaluation. Encouraged heart healthy diet and regular exercise.   FH: colon cancer Referred for colonoscopy due to FH of colon cancer and recent rectal bleeding.  Irregular menses Try Garnette ScheuermannKariva daily start on next Sunday after next cycle starts

## 2014-01-08 NOTE — Assessment & Plan Note (Signed)
Mild, avoid trans fats, increase exercise

## 2014-06-15 ENCOUNTER — Encounter: Payer: Self-pay | Admitting: Family Medicine

## 2014-06-15 ENCOUNTER — Ambulatory Visit (INDEPENDENT_AMBULATORY_CARE_PROVIDER_SITE_OTHER): Payer: Federal, State, Local not specified - PPO | Admitting: Family Medicine

## 2014-06-15 VITALS — BP 108/88 | HR 84 | Temp 98.3°F | Ht 62.0 in | Wt 203.0 lb

## 2014-06-15 DIAGNOSIS — K219 Gastro-esophageal reflux disease without esophagitis: Secondary | ICD-10-CM

## 2014-06-15 DIAGNOSIS — M1990 Unspecified osteoarthritis, unspecified site: Secondary | ICD-10-CM

## 2014-06-15 DIAGNOSIS — M129 Arthropathy, unspecified: Secondary | ICD-10-CM

## 2014-06-15 DIAGNOSIS — M25559 Pain in unspecified hip: Secondary | ICD-10-CM

## 2014-06-15 DIAGNOSIS — E785 Hyperlipidemia, unspecified: Secondary | ICD-10-CM

## 2014-06-15 DIAGNOSIS — E669 Obesity, unspecified: Secondary | ICD-10-CM

## 2014-06-15 LAB — LIPID PANEL
CHOL/HDL RATIO: 4 ratio
CHOLESTEROL: 206 mg/dL — AB (ref 0–200)
HDL: 52 mg/dL (ref >39)
LDL Cholesterol: 108 mg/dL — ABNORMAL HIGH (ref 0–99)
Triglycerides: 228 mg/dL — ABNORMAL HIGH (ref <150)
VLDL: 46 mg/dL — AB (ref 0–40)

## 2014-06-15 LAB — RENAL FUNCTION PANEL
Albumin: 4.1 g/dL (ref 3.5–5.2)
BUN: 12 mg/dL (ref 6–23)
CHLORIDE: 104 meq/L (ref 96–112)
CO2: 24 meq/L (ref 19–32)
Calcium: 9.2 mg/dL (ref 8.4–10.5)
Creat: 0.84 mg/dL (ref 0.50–1.10)
GLUCOSE: 85 mg/dL (ref 70–99)
PHOSPHORUS: 3.8 mg/dL (ref 2.3–4.6)
POTASSIUM: 4.4 meq/L (ref 3.5–5.3)
Sodium: 136 mEq/L (ref 135–145)

## 2014-06-15 LAB — SEDIMENTATION RATE: Sed Rate: 5 mm/hr (ref 0–22)

## 2014-06-15 LAB — CBC
HEMATOCRIT: 37.2 % (ref 36.0–46.0)
HEMOGLOBIN: 12.4 g/dL (ref 12.0–15.0)
MCH: 26.2 pg (ref 26.0–34.0)
MCHC: 33.3 g/dL (ref 30.0–36.0)
MCV: 78.6 fL (ref 78.0–100.0)
Platelets: 282 10*3/uL (ref 150–400)
RBC: 4.73 MIL/uL (ref 3.87–5.11)
RDW: 15.5 % (ref 11.5–15.5)
WBC: 7 10*3/uL (ref 4.0–10.5)

## 2014-06-15 LAB — TSH: TSH: 2.101 u[IU]/mL (ref 0.350–4.500)

## 2014-06-15 LAB — RHEUMATOID FACTOR: Rhuematoid fact SerPl-aCnc: <10 IU/mL (ref <=14)

## 2014-06-15 NOTE — Progress Notes (Signed)
Pre visit review using our clinic review tool, if applicable. No additional management support is needed unless otherwise documented below in the visit note. 

## 2014-06-15 NOTE — Patient Instructions (Signed)
Melatonin or L Tyrptophan, Hyland's Calms Forte  Tylenol twice 500 mg  Curcumen/Turmeric daily Regular exercise, 6-8 hours of sleep Krill oil Vitamin D 2000IU daily

## 2014-06-16 LAB — ANA: ANA: NEGATIVE

## 2014-06-18 ENCOUNTER — Encounter: Payer: Self-pay | Admitting: Family Medicine

## 2014-06-18 DIAGNOSIS — M1990 Unspecified osteoarthritis, unspecified site: Secondary | ICD-10-CM

## 2014-06-18 HISTORY — DX: Unspecified osteoarthritis, unspecified site: M19.90

## 2014-06-18 NOTE — Progress Notes (Signed)
Patient ID: Lisa Ray, female   DOB: 02-Sep-1971, 43 y.o.   MRN: [id number] Lisa Ray [id number] July 22, 1971 06/18/2014      Progress Note-Follow Up  Subjective  Chief Complaint  Chief Complaint  Patient presents with  . Follow-up    HPI  Patient is a 43 year old female in today for routine medical care. In today complaining of increased stiffness and pain in muscles and joints. Most notable  in her knees, hips and shoulders. Most notable in the morning. When she gets up and moves around the symptoms improved somewhat. No recent injury or illness. Denies CP/palp/SOB/HA/congestion/fevers/GI or GU c/o. Taking meds as prescribed  Past Medical History  Diagnosis Date  . Migraines   . Chicken pox as a child    X 2  . Kidney stones 7-12    X 3  . Diverticulosis 2008  . Colon polyps 2008  . GERD (gastroesophageal reflux disease) 2008  . Erosive inflammation of stomach lining 2008  . Obesity 10/08/2011  . Preventative health care 10/13/2011  . Foot pain, right 10/13/2011  . Ovarian cyst, right   . Bronchitis 11/05/2011  . Left shoulder pain 01/04/2012  . Skin tag 06/01/2012  . Cough 06/01/2012  . Diverticulosis 03/25/2013  . Solitary pulmonary nodule 03/25/2013  . Arthritis 06/18/2014    Past Surgical History  Procedure Laterality Date  . Lithotripsy  2008  . Tonsilectomy, adenoidectomy, bilateral myringotomy and tubes      Age 44  . Stent for kidney stone  7-12  . Ovarian cyst removal      Family History  Problem Relation Age of Onset  . Hypertension Mother   . Cancer Mother     skin, melanoma  . Migraines Mother   . Heart disease Maternal Grandmother   . Colon cancer Maternal Grandmother   . Colon cancer Maternal Grandfather   . Colon cancer Maternal Grandfather   . Bipolar disorder Daughter     History   Social History  . Marital Status: Married    Spouse Name: Lisa Ray    Number of Children: 3  . Years of Education: Assoc degr   Occupational  History  . human resources     [employer]    Social History Main Topics  . Smoking status: Never Smoker   . Smokeless tobacco: Never Used  . Alcohol Use: 1.5 oz/week    3 drink(s) per week     Comment: occasionally  . Drug Use: No  . Sexual Activity: Yes    Partners: Male   Other Topics Concern  . Not on file   Social History Narrative   Lives with her partner Lisa Ray.           Current Outpatient Prescriptions on File Prior to Visit  Medication Sig Dispense Refill  . desogestrel-ethinyl estradiol (KARIVA) 0.15-0.02/0.01 MG (21/5) tablet Take 1 tablet by mouth daily.  1 Package  11  . omeprazole (PRILOSEC) 40 MG capsule Take 40 mg by mouth daily.      . Probiotic Product (PROBIOTIC DAILY PO) Take by mouth daily.      . rizatriptan (MAXALT) 10 MG tablet Take 1 tablet (10 mg total) by mouth as needed. May repeat in 2 hours if needed  18 tablet  1  . promethazine (PHENERGAN) 25 MG tablet Take 1 tablet (25 mg total) by mouth every 8 (eight) hours as needed for nausea or vomiting.  20 tablet  0   No current facility-administered  medications on file prior to visit.    No Known Allergies  Review of Systems  Review of Systems  Constitutional: Positive for malaise/fatigue. Negative for fever.  HENT: Negative for congestion.   Eyes: Negative for discharge.  Respiratory: Negative for shortness of breath.   Cardiovascular: Negative for chest pain, palpitations and leg swelling.  Gastrointestinal: Negative for nausea, abdominal pain and diarrhea.  Genitourinary: Negative for dysuria.  Musculoskeletal: Positive for joint pain and myalgias. Negative for falls.  Skin: Negative for rash.  Neurological: Negative for loss of consciousness and headaches.  Endo/Heme/Allergies: Negative for polydipsia.  Psychiatric/Behavioral: Negative for depression and suicidal ideas. The patient is not nervous/anxious and does not have insomnia.     Objective  BP 108/88  Pulse 84  Temp(Src) 98.3  F (36.8 C) (Oral)  Ht 5\' 2"  (1.575 m)  Wt 203 lb (92.08 kg)  BMI 37.12 kg/m2  SpO2 97%  LMP 06/01/2014  Physical Exam  Physical Exam  Constitutional: She is oriented to person, place, and time and well-developed, well-nourished, and in no distress. No distress.  HENT:  Head: Normocephalic and atraumatic.  Right Ear: External ear normal.  Left Ear: External ear normal.  Nose: Nose normal.  Mouth/Throat: Oropharynx is clear and moist. No oropharyngeal exudate.  Eyes: Conjunctivae are normal. Pupils are equal, round, and reactive to light. Right eye exhibits no discharge. Left eye exhibits no discharge. No scleral icterus.  Neck: Normal range of motion. Neck supple. No thyromegaly present.  Cardiovascular: Normal rate, regular rhythm, normal heart sounds and intact distal pulses.   No murmur heard. Pulmonary/Chest: Effort normal and breath sounds normal. No respiratory distress. She has no wheezes. She has no rales.  Abdominal: Soft. Bowel sounds are normal. She exhibits no distension and no mass. There is no tenderness.  Musculoskeletal: Normal range of motion. She exhibits no edema and no tenderness.  Lymphadenopathy:    She has no cervical adenopathy.  Neurological: She is alert and oriented to person, place, and time. She has normal reflexes. No cranial nerve deficit. Coordination normal.  Skin: Skin is warm and dry. No rash noted. She is not diaphoretic.  Psychiatric: Mood, memory and affect normal.    Lab Results  Component Value Date   TSH 2.101 06/15/2014   Lab Results  Component Value Date   WBC 7.0 06/15/2014   HGB 12.4 06/15/2014   HCT 37.2 06/15/2014   MCV 78.6 06/15/2014   PLT 282 06/15/2014   Lab Results  Component Value Date   CREATININE 0.84 06/15/2014   BUN 12 06/15/2014   NA 136 06/15/2014   K 4.4 06/15/2014   CL 104 06/15/2014   CO2 24 06/15/2014   Lab Results  Component Value Date   ALT 11 10/08/2011   AST 17 10/08/2011   ALKPHOS 76 10/08/2011   BILITOT  0.4 10/08/2011   Lab Results  Component Value Date   CHOL 206* 06/15/2014   Lab Results  Component Value Date   HDL 52 06/15/2014   Lab Results  Component Value Date   LDLCALC 108* 06/15/2014   Lab Results  Component Value Date   TRIG 228* 06/15/2014   Lab Results  Component Value Date   CHOLHDL 4.0 06/15/2014     Assessment & Plan  Obesity Encouraged DASH diet, decrease po intake and increase exercise as tolerated. Needs 7-8 hours of sleep nightly. Avoid trans fats, eat small, frequent meals every 4-5 hours with lean proteins, complex carbs and healthy fats. Minimize simple  carbs, GMO foods.  GERD (gastroesophageal reflux disease) Avoid offending foods, start probiotics. Do not eat large meals in late evening and consider raising head of bed.   Arthritis Worsening stiffness in joints and most notably in hips and knees. Shoulders to a lesser degree. Encouraged increased activity, clean diet, weight loss and add Curcumen and Tylenol

## 2014-06-18 NOTE — Assessment & Plan Note (Signed)
Encouraged DASH diet, decrease po intake and increase exercise as tolerated. Needs 7-8 hours of sleep nightly. Avoid trans fats, eat small, frequent meals every 4-5 hours with lean proteins, complex carbs and healthy fats. Minimize simple carbs, GMO foods. 

## 2014-06-18 NOTE — Assessment & Plan Note (Signed)
Worsening stiffness in joints and most notably in hips and knees. Shoulders to a lesser degree. Encouraged increased activity, clean diet, weight loss and add Curcumen and Tylenol

## 2014-06-18 NOTE — Assessment & Plan Note (Signed)
Avoid offending foods, start probiotics. Do not eat large meals in late evening and consider raising head of bed.  

## 2014-07-28 ENCOUNTER — Encounter: Payer: Self-pay | Admitting: Family Medicine

## 2014-07-28 ENCOUNTER — Ambulatory Visit (INDEPENDENT_AMBULATORY_CARE_PROVIDER_SITE_OTHER): Payer: Federal, State, Local not specified - PPO | Admitting: Family Medicine

## 2014-07-28 VITALS — BP 132/89 | HR 82 | Temp 98.3°F | Ht 62.0 in | Wt 202.2 lb

## 2014-07-28 DIAGNOSIS — M25552 Pain in left hip: Secondary | ICD-10-CM

## 2014-07-28 DIAGNOSIS — M25569 Pain in unspecified knee: Secondary | ICD-10-CM

## 2014-07-28 DIAGNOSIS — M25559 Pain in unspecified hip: Secondary | ICD-10-CM

## 2014-07-28 DIAGNOSIS — K219 Gastro-esophageal reflux disease without esophagitis: Secondary | ICD-10-CM

## 2014-07-28 DIAGNOSIS — M25562 Pain in left knee: Secondary | ICD-10-CM

## 2014-07-28 DIAGNOSIS — M25512 Pain in left shoulder: Secondary | ICD-10-CM

## 2014-07-28 DIAGNOSIS — M25519 Pain in unspecified shoulder: Secondary | ICD-10-CM

## 2014-07-28 DIAGNOSIS — E669 Obesity, unspecified: Secondary | ICD-10-CM

## 2014-07-28 NOTE — Patient Instructions (Signed)
[employer] patches, [employer], [email or web address] Darcy Ward, chiropractor, CC[facility], [facility]  Bursitis Bursitis is a swelling and soreness (inflammation) of a fluid-filled sac (bursa) that overlies and protects a joint. It can be caused by injury, overuse of the joint, arthritis or infection. The joints most likely to be affected are the elbows, shoulders, hips and knees. HOME CARE INSTRUCTIONS   Apply ice to the affected area for 15-20 minutes each hour while awake for 2 days. Put the ice in a plastic bag and place a towel between the bag of ice and your skin.  Rest the injured joint as much as possible, but continue to put the joint through a full range of motion, 4 times per day. (The shoulder joint especially becomes rapidly "frozen" if not used.) When the pain lessens, begin normal slow movements and usual activities.  Only take over-the-counter or prescription medicines for pain, discomfort or fever as directed by your caregiver.  Your caregiver may recommend draining the bursa and injecting medicine into the bursa. This may help the healing process.  Follow all instructions for follow-up with your caregiver. This includes any orthopedic referrals, physical therapy and rehabilitation. Any delay in obtaining necessary care could result in a delay or failure of the bursitis to heal and chronic pain. SEEK IMMEDIATE MEDICAL CARE IF:   Your pain increases even during treatment.  You develop an oral temperature above 102 F (38.9 C) and have heat and inflammation over the involved bursa. MAKE SURE YOU:   Understand these instructions.  Will watch your condition.  Will get help right away if you are not doing well or get worse. Document Released: 10/17/2000 Document Revised: 01/12/2012 Document Reviewed: 01/09/2014 [facility] Patient Information 2015 [place], [place]. This information is not intended to replace advice given to you by your health care provider. Make sure you  discuss any questions you have with your health care provider.

## 2014-07-28 NOTE — Progress Notes (Signed)
Pre visit review using our clinic review tool, if applicable. No additional management support is needed unless otherwise documented below in the visit note. 

## 2014-07-30 ENCOUNTER — Encounter: Payer: Self-pay | Admitting: Family Medicine

## 2014-07-30 NOTE — Assessment & Plan Note (Signed)
Has failed conservative management and pain is worsening no injury, is referred to sports med for further consideration of knee and hip pain

## 2014-07-30 NOTE — Assessment & Plan Note (Signed)
Avoid offending foods, start probiotics. Do not eat large meals in late evening and consider raising head of bed.  

## 2014-07-30 NOTE — Assessment & Plan Note (Signed)
Encouraged DASH diet, decrease po intake and increase exercise as tolerated. Needs 7-8 hours of sleep nightly. Avoid trans fats, eat small, frequent meals every 4-5 hours with lean proteins, complex carbs and healthy fats. Minimize simple carbs, GMO foods. 

## 2014-07-30 NOTE — Progress Notes (Signed)
Patient ID: Lisa Ray, female   DOB: 1971/01/04, 43 y.o.   MRN: [id number] Lisa Ray [id number] 1970/11/14 07/30/2014      Progress Note-Follow Up  Subjective  Chief Complaint  Chief Complaint  Patient presents with  . Follow-up    5 week    HPI  Patient is a 43 year old female in today for routine medical care. Patient is in today for followup on numerous conditions including pain. She has not had any improvement in her hip and knee pain and reports the really has worsened. No injury. Has trouble bilaterally but the left is worse than the right. No swelling warmth or redness is noted. Lab work thus far has been unremarkable. Patient has been fairly sedentary. Denies CP/palp/SOB/HA/congestion/fevers/GI or GU c/o. Taking meds as prescribed  Past Medical History  Diagnosis Date  . Migraines   . Chicken pox as a child    X 2  . Kidney stones 7-12    X 3  . Diverticulosis 2008  . Colon polyps 2008  . GERD (gastroesophageal reflux disease) 2008  . Erosive inflammation of stomach lining 2008  . Obesity 10/08/2011  . Preventative health care 10/13/2011  . Foot pain, right 10/13/2011  . Ovarian cyst, right   . Bronchitis 11/05/2011  . Left shoulder pain 01/04/2012  . Skin tag 06/01/2012  . Cough 06/01/2012  . Diverticulosis 03/25/2013  . Solitary pulmonary nodule 03/25/2013  . Arthritis 06/18/2014    Past Surgical History  Procedure Laterality Date  . Lithotripsy  2008  . Tonsilectomy, adenoidectomy, bilateral myringotomy and tubes      Age 20  . Stent for kidney stone  7-12  . Ovarian cyst removal      Family History  Problem Relation Age of Onset  . Hypertension Mother   . Cancer Mother     skin, melanoma  . Migraines Mother   . Heart disease Maternal Grandmother   . Colon cancer Maternal Grandmother   . Colon cancer Maternal Grandfather   . Colon cancer Maternal Grandfather   . Bipolar disorder Daughter     History   Social History  . Marital Status:  Married    Spouse Name: Teriann Livingood    Number of Children: 3  . Years of Education: Assoc degr   Occupational History  . human resources     [employer]    Social History Main Topics  . Smoking status: Never Smoker   . Smokeless tobacco: Never Used  . Alcohol Use: 1.5 oz/week    3 drink(s) per week     Comment: occasionally  . Drug Use: No  . Sexual Activity: Yes    Partners: Male   Other Topics Concern  . Not on file   Social History Narrative   Lives with her partner Shanti Agresti.           Current Outpatient Prescriptions on File Prior to Visit  Medication Sig Dispense Refill  . desogestrel-ethinyl estradiol (KARIVA) 0.15-0.02/0.01 MG (21/5) tablet Take 1 tablet by mouth daily.  1 Package  11  . omeprazole (PRILOSEC) 40 MG capsule Take 40 mg by mouth daily.      . Probiotic Product (PROBIOTIC DAILY PO) Take by mouth daily.      . promethazine (PHENERGAN) 25 MG tablet Take 1 tablet (25 mg total) by mouth every 8 (eight) hours as needed for nausea or vomiting.  20 tablet  0  . rizatriptan (MAXALT) 10 MG tablet Take 1 tablet (  10 mg total) by mouth as needed. May repeat in 2 hours if needed  18 tablet  1   No current facility-administered medications on file prior to visit.    No Known Allergies  Review of Systems  Review of Systems  Constitutional: Negative for fever and malaise/fatigue.  HENT: Negative for congestion.   Eyes: Negative for discharge.  Respiratory: Negative for shortness of breath.   Cardiovascular: Negative for chest pain, palpitations and leg swelling.  Gastrointestinal: Negative for nausea, abdominal pain and diarrhea.  Genitourinary: Negative for dysuria.  Musculoskeletal: Positive for joint pain. Negative for falls.  Skin: Negative for rash.  Neurological: Negative for loss of consciousness and headaches.  Endo/Heme/Allergies: Negative for polydipsia.  Psychiatric/Behavioral: Negative for depression and suicidal ideas. The patient is not  nervous/anxious and does not have insomnia.     Objective  BP 132/89  Pulse 82  Temp(Src) 98.3 F (36.8 C) (Oral)  Ht  (1.575 m)  Wt 202 lb 3.2 oz (91.717 kg)  BMI 36.97 kg/m2  SpO2 98%  LMP 07/22/2014  Physical Exam  Physical Exam  Constitutional: She is oriented to person, place, and time and well-developed, well-nourished, and in no distress. No distress.  HENT:  Head: Normocephalic and atraumatic.  Eyes: Conjunctivae are normal.  Neck: Neck supple. No thyromegaly present.  Cardiovascular: Normal rate, regular rhythm and normal heart sounds.   No murmur heard. Pulmonary/Chest: Effort normal and breath sounds normal. She has no wheezes.  Abdominal: She exhibits no distension and no mass.  Musculoskeletal: She exhibits no edema.  Lymphadenopathy:    She has no cervical adenopathy.  Neurological: She is alert and oriented to person, place, and time.  Skin: Skin is warm and dry. No rash noted. She is not diaphoretic.  Psychiatric: Memory, affect and judgment normal.    Lab Results  Component Value Date   TSH 2.101 06/15/2014   Lab Results  Component Value Date   WBC 7.0 06/15/2014   HGB 12.4 06/15/2014   HCT 37.2 06/15/2014   MCV 78.6 06/15/2014   PLT 282 06/15/2014   Lab Results  Component Value Date   CREATININE 0.84 06/15/2014   BUN 12 06/15/2014   NA 136 06/15/2014   K 4.4 06/15/2014   CL 104 06/15/2014   CO2 24 06/15/2014   Lab Results  Component Value Date   ALT 11 10/08/2011   AST 17 10/08/2011   ALKPHOS 76 10/08/2011   BILITOT 0.4 10/08/2011   Lab Results  Component Value Date   CHOL 206* 06/15/2014   Lab Results  Component Value Date   HDL 52 06/15/2014   Lab Results  Component Value Date   LDLCALC 108* 06/15/2014   Lab Results  Component Value Date   TRIG 228* 06/15/2014   Lab Results  Component Value Date   CHOLHDL 4.0 06/15/2014     Assessment & Plan  GERD (gastroesophageal reflux disease) Avoid offending foods, start probiotics. Do  not eat large meals in late evening and consider raising head of bed.   Left shoulder pain Has failed conservative management and pain is worsening no injury, is referred to sports med for further consideration of knee and hip pain  Obesity Encouraged DASH diet, decrease po intake and increase exercise as tolerated. Needs 7-8 hours of sleep nightly. Avoid trans fats, eat small, frequent meals every 4-5 hours with lean proteins, complex carbs and healthy fats. Minimize simple carbs, GMO foods.

## 2014-08-04 ENCOUNTER — Ambulatory Visit: Payer: Federal, State, Local not specified - PPO | Admitting: Family Medicine

## 2014-08-09 ENCOUNTER — Ambulatory Visit: Payer: Federal, State, Local not specified - PPO | Admitting: Family Medicine

## 2014-08-11 ENCOUNTER — Other Ambulatory Visit: Payer: Self-pay | Admitting: Family Medicine

## 2014-12-11 ENCOUNTER — Other Ambulatory Visit: Payer: Self-pay | Admitting: Family Medicine

## 2014-12-11 NOTE — Telephone Encounter (Signed)
Last filled

## 2014-12-29 ENCOUNTER — Ambulatory Visit (INDEPENDENT_AMBULATORY_CARE_PROVIDER_SITE_OTHER): Payer: Federal, State, Local not specified - PPO | Admitting: Physician Assistant

## 2014-12-29 ENCOUNTER — Encounter: Payer: Self-pay | Admitting: Physician Assistant

## 2014-12-29 VITALS — BP 116/67 | HR 87 | Temp 98.4°F | Resp 16 | Ht 62.0 in | Wt 200.2 lb

## 2014-12-29 DIAGNOSIS — F410 Panic disorder [episodic paroxysmal anxiety] without agoraphobia: Secondary | ICD-10-CM

## 2014-12-29 MED ORDER — RANITIDINE HCL 150 MG PO TABS: 150.0000 mg | ORAL_TABLET | Freq: Two times a day (BID) | ORAL | Status: DC

## 2014-12-29 MED ORDER — AMITRIPTYLINE HCL 10 MG PO TABS: 10.0000 mg | ORAL_TABLET | Freq: Every day | ORAL | Status: DC

## 2014-12-29 MED ORDER — DIAZEPAM 2 MG PO TABS: 2.0000 mg | ORAL_TABLET | Freq: Four times a day (QID) | ORAL | Status: DC | PRN

## 2014-12-29 NOTE — Patient Instructions (Addendum)
Please begin taking medications as directed.   Use the Valium as directed for acute anxiety. Continue other medications as directed.  Follow-up in 3-4 weeks.  If you notice a worsening of symptoms on medication, please stop the medication and call our office.  Panic Attacks Panic attacks are sudden, short feelings of great fear or discomfort. You may have them for no reason when you are relaxed, when you are uneasy (anxious), or when you are sleeping.  HOME CARE  Take all your medicines as told.  Check with your doctor before starting new medicines.  Keep all doctor visits. GET HELP IF:  You are not able to take your medicines as told.  Your symptoms do not get better.  Your symptoms get worse. GET HELP RIGHT AWAY IF:  Your attacks seem different than your normal attacks.  You have thoughts about hurting yourself or others.  You take panic attack medicine and you have a side effect. MAKE SURE YOU:  Understand these instructions.  Will watch your condition.  Will get help right away if you are not doing well or get worse. Document Released: 11/22/2010 Document Revised: 08/10/2013 Document Reviewed: 06/03/2013 [facility] Patient Information 2015 [place], [place]. This information is not intended to replace advice given to you by your health care provider. Make sure you discuss any questions you have with your health care provider.

## 2014-12-29 NOTE — Progress Notes (Signed)
Pre visit review using our clinic review tool, if applicable. No additional management support is needed unless otherwise documented below in the visit note/SLS  

## 2015-01-01 ENCOUNTER — Telehealth: Payer: Self-pay | Admitting: Family Medicine

## 2015-01-01 DIAGNOSIS — F410 Panic disorder [episodic paroxysmal anxiety] without agoraphobia: Secondary | ICD-10-CM | POA: Insufficient documentation

## 2015-01-01 MED ORDER — RIZATRIPTAN BENZOATE 10 MG PO TABS: ORAL_TABLET | ORAL | Status: DC

## 2015-01-01 MED ORDER — PROMETHAZINE HCL 25 MG PO TABS: 25.0000 mg | ORAL_TABLET | Freq: Three times a day (TID) | ORAL | Status: DC | PRN

## 2015-01-01 NOTE — Progress Notes (Signed)
Patient presents to clinic today c/o significant anxiety over the past few months, revolving around issues at home. Patient states anxiety has been generalized and mostly related to stress levels.  Over the past few weeks has been worsening with panic attacks.  Denies depressed mood, SI/HI.  Past Medical History  Diagnosis Date  . Migraines   . Chicken pox as a child    X 2  . Kidney stones 7-12    X 3  . Diverticulosis 2008  . Colon polyps 2008  . GERD (gastroesophageal reflux disease) 2008  . Erosive inflammation of stomach lining 2008  . Obesity 10/08/2011  . Preventative health care 10/13/2011  . Foot pain, right 10/13/2011  . Ovarian cyst, right   . Bronchitis 11/05/2011  . Left shoulder pain 01/04/2012  . Skin tag 06/01/2012  . Cough 06/01/2012  . Diverticulosis 03/25/2013  . Solitary pulmonary nodule 03/25/2013  . Arthritis 06/18/2014    Current Outpatient Prescriptions on File Prior to Visit  Medication Sig Dispense Refill  . Probiotic Product (PROBIOTIC DAILY PO) Take by mouth daily.    Marland Kitchen. VIORELE 0.15-0.02/0.01 MG (21/5) tablet TAKE 1 TABLET BY MOUTH DAILY. 28 tablet 6   No current facility-administered medications on file prior to visit.    No Known Allergies  Family History  Problem Relation Age of Onset  . Hypertension Mother   . Cancer Mother     skin, melanoma  . Migraines Mother   . Heart disease Maternal Grandmother   . Colon cancer Maternal Grandmother   . Colon cancer Maternal Grandfather   . Colon cancer Maternal Grandfather   . Bipolar disorder Daughter     History   Social History  . Marital Status: Married    Spouse Name: Eduard ClosRuben Voisin  . Number of Children: 3  . Years of Education: Assoc degr   Occupational History  . human resources     [employer]    Social History Main Topics  . Smoking status: Never Smoker   . Smokeless tobacco: Never Used  . Alcohol Use: 1.5 oz/week    3 drink(s) per week     Comment: occasionally  . Drug Use: No  .  Sexual Activity:    Partners: Male   Other Topics Concern  . None   Social History Narrative   Lives with her partner Eduard Closruben Bernardini.          Review of Systems - See HPI.  All other ROS are negative.  BP 116/67 mmHg  Pulse 87  Temp(Src) 98.4 F (36.9 C) (Oral)  Resp 16  Ht 5\' 2"  (1.575 m)  Wt 200 lb 4 oz (90.833 kg)  BMI 36.62 kg/m2  SpO2 100%  LMP 12/15/2014  Physical Exam  Constitutional: She is oriented to person, place, and time and well-developed, well-nourished, and in no distress.  HENT:  Head: Normocephalic and atraumatic.  Neck: Neck supple. No thyromegaly present.  Cardiovascular: Normal rate, regular rhythm, normal heart sounds and intact distal pulses.   Pulmonary/Chest: Effort normal and breath sounds normal. No respiratory distress. She has no wheezes. She has no rales. She exhibits no tenderness.  Neurological: She is alert and oriented to person, place, and time.  Skin: Skin is warm and dry. No rash noted.  Psychiatric: Her mood appears anxious. Her affect is labile.  Vitals reviewed.  Assessment/Plan: Panic attack With underlying anxiety disorder.  Giving comorbidity, will begin Elavil 10 mg nightly.  Will initiate Valium 2 mg Q6H for  acute anxiety.  Handout given for [facility] counseling services.  Patient encouraged to schedule appointment.  Potential side effects of medication discussed with patient.  Follow-up in 3-4 weeks.

## 2015-01-01 NOTE — Assessment & Plan Note (Signed)
With underlying anxiety disorder.  Giving comorbidity, will begin Elavil 10 mg nightly.  Will initiate Valium 2 mg Q6H for acute anxiety.  Handout given for [facility] counseling services.  Patient encouraged to schedule appointment.  Potential side effects of medication discussed with patient.  Follow-up in 3-4 weeks.

## 2015-01-01 NOTE — Telephone Encounter (Signed)
[facility]

## 2015-01-10 ENCOUNTER — Encounter: Payer: Self-pay | Admitting: Physician Assistant

## 2015-01-10 ENCOUNTER — Ambulatory Visit (INDEPENDENT_AMBULATORY_CARE_PROVIDER_SITE_OTHER): Payer: Federal, State, Local not specified - PPO | Admitting: Physician Assistant

## 2015-01-10 ENCOUNTER — Ambulatory Visit (INDEPENDENT_AMBULATORY_CARE_PROVIDER_SITE_OTHER): Payer: Federal, State, Local not specified - PPO | Admitting: Psychology

## 2015-01-10 VITALS — BP 150/82 | HR 80 | Temp 98.6°F | Resp 16 | Ht 62.0 in | Wt 201.0 lb

## 2015-01-10 DIAGNOSIS — F5105 Insomnia due to other mental disorder: Secondary | ICD-10-CM

## 2015-01-10 DIAGNOSIS — F4323 Adjustment disorder with mixed anxiety and depressed mood: Secondary | ICD-10-CM | POA: Diagnosis not present

## 2015-01-10 DIAGNOSIS — F409 Phobic anxiety disorder, unspecified: Secondary | ICD-10-CM | POA: Insufficient documentation

## 2015-01-10 MED ORDER — ZOLPIDEM TARTRATE 5 MG PO TABS: 5.0000 mg | ORAL_TABLET | Freq: Every evening | ORAL | Status: DC | PRN

## 2015-01-10 NOTE — Progress Notes (Signed)
Pre visit review using our clinic review tool, if applicable. No additional management support is needed unless otherwise documented below in the visit note/SLS  

## 2015-01-10 NOTE — Assessment & Plan Note (Signed)
Significant stressors causing her symptoms. Recommended increase of volume to 4 mg every 12 hours as needed we'll give short course of Ambien 5 mg nightly to help sleep. Encouraged increased exercise as this will promote more restful sleep and is a stress outlet. Follow-up in 2-3 weeks.

## 2015-01-10 NOTE — Progress Notes (Signed)
Patient presents to clinic today  For follow-up of her anxiety and panic attacks.   Patient endorses improvement in overall anxiety with Elavil. Is taking her Valium only occasionally with some relief of symptoms. Is mostly  Having difficulty sleeping over the past several days. Endorses her daughter has been involved with drugs and has just moved out of the home , which is causing the patient significant worry. Patient denies suicidal thought or ideation. Has just had her first appointment with counseling this morning.  Past Medical History  Diagnosis Date  . Migraines   . Chicken pox as a child    X 2  . Kidney stones 7-12    X 3  . Diverticulosis 2008  . Colon polyps 2008  . GERD (gastroesophageal reflux disease) 2008  . Erosive inflammation of stomach lining 2008  . Obesity 10/08/2011  . Preventative health care 10/13/2011  . Foot pain, right 10/13/2011  . Ovarian cyst, right   . Bronchitis 11/05/2011  . Left shoulder pain 01/04/2012  . Skin tag 06/01/2012  . Cough 06/01/2012  . Diverticulosis 03/25/2013  . Solitary pulmonary nodule 03/25/2013  . Arthritis 06/18/2014    Current Outpatient Prescriptions on File Prior to Visit  Medication Sig Dispense Refill  . amitriptyline (ELAVIL) 10 MG tablet Take 1 tablet (10 mg total) by mouth at bedtime. 30 tablet 1  . diazepam (VALIUM) 2 MG tablet Take 1 tablet (2 mg total) by mouth every 6 (six) hours as needed for anxiety. 30 tablet 0  . Probiotic Product (PROBIOTIC DAILY PO) Take by mouth daily.    . promethazine (PHENERGAN) 25 MG tablet Take 1 tablet (25 mg total) by mouth every 8 (eight) hours as needed for nausea or vomiting. 20 tablet 0  . ranitidine (ZANTAC) 150 MG tablet Take 1 tablet (150 mg total) by mouth 2 (two) times daily. 60 tablet 1  . rizatriptan (MAXALT) 10 MG tablet TAKE 1 TABLET (10 MG TOTAL) BY MOUTH AS NEEDED. MAY REPEAT IN 2 HOURS IF NEEDED 18 tablet 1  . VIORELE 0.15-0.02/0.01 MG (21/5) tablet TAKE 1 TABLET BY MOUTH  DAILY. 28 tablet 6   No current facility-administered medications on file prior to visit.    No Known Allergies  Family History  Problem Relation Age of Onset  . Hypertension Mother   . Cancer Mother     skin, melanoma  . Migraines Mother   . Heart disease Maternal Grandmother   . Colon cancer Maternal Grandmother   . Colon cancer Maternal Grandfather   . Colon cancer Maternal Grandfather   . Bipolar disorder Daughter     History   Social History  . Marital Status: Married    Spouse Name: Cindra Austad  . Number of Children: 3  . Years of Education: Assoc degr   Occupational History  . human resources     [employer]    Social History Main Topics  . Smoking status: Never Smoker   . Smokeless tobacco: Never Used  . Alcohol Use: 1.5 oz/week    3 drink(s) per week     Comment: occasionally  . Drug Use: No  . Sexual Activity:    Partners: Male   Other Topics Concern  . None   Social History Narrative   Lives with her partner Weslynn Ke.          Review of Systems - See HPI.  All other ROS are negative.  BP 150/82 mmHg  Pulse 80  Temp(Src) 98.6  F (37 C) (Oral)  Resp 16  Ht 5\' 2"  (1.575 m)  Wt 201 lb (91.173 kg)  BMI 36.75 kg/m2  SpO2 99%  LMP 01/10/2015  Physical Exam  Constitutional: She is oriented to person, place, and time and well-developed, well-nourished, and in no distress.  HENT:  Head: Normocephalic and atraumatic.  Cardiovascular: Normal rate, regular rhythm, normal heart sounds and intact distal pulses.   Pulmonary/Chest: Effort normal and breath sounds normal. No respiratory distress. She has no wheezes. She has no rales. She exhibits no tenderness.  Neurological: She is alert and oriented to person, place, and time.  Skin: Skin is warm and dry. No rash noted.  Vitals reviewed.  No results found for this or any previous visit (from the past 2160 hour(s)).  Assessment/Plan: Insomnia due to anxiety and fear  Significant stressors causing  her symptoms. Recommended increase of volume to 4 mg every 12 hours as needed we'll give short course of Ambien 5 mg nightly to help sleep. Encouraged increased exercise as this will promote more restful sleep and is a stress outlet. Follow-up in 2-3 weeks.

## 2015-01-10 NOTE — Patient Instructions (Signed)
Please continue medications as directed.  Ok to increase the Valium to 2 tablets (4mg ) every 12 hours if needed.  Use Ambien at bedtime.  Remember to get some exercise as this is a great stress-reliever and will promote more restful sleep.

## 2015-01-29 ENCOUNTER — Ambulatory Visit: Payer: Federal, State, Local not specified - PPO | Admitting: Physician Assistant

## 2015-01-30 ENCOUNTER — Ambulatory Visit (INDEPENDENT_AMBULATORY_CARE_PROVIDER_SITE_OTHER): Payer: Federal, State, Local not specified - PPO | Admitting: Physician Assistant

## 2015-01-30 ENCOUNTER — Encounter: Payer: Self-pay | Admitting: Physician Assistant

## 2015-01-30 VITALS — BP 118/82 | HR 89 | Temp 98.6°F | Resp 16 | Ht 62.0 in | Wt 202.1 lb

## 2015-01-30 DIAGNOSIS — F409 Phobic anxiety disorder, unspecified: Secondary | ICD-10-CM

## 2015-01-30 DIAGNOSIS — F410 Panic disorder [episodic paroxysmal anxiety] without agoraphobia: Secondary | ICD-10-CM | POA: Diagnosis not present

## 2015-01-30 DIAGNOSIS — F5105 Insomnia due to other mental disorder: Secondary | ICD-10-CM

## 2015-01-30 MED ORDER — ZOLPIDEM TARTRATE 10 MG PO TABS
10.0000 mg | ORAL_TABLET | Freq: Every evening | ORAL | Status: DC | PRN
Start: 2015-01-30 — End: 2015-04-03

## 2015-01-30 NOTE — Progress Notes (Signed)
Pre visit review using our clinic review tool, if applicable. No additional management support is needed unless otherwise documented below in the visit note/SLS  

## 2015-01-30 NOTE — Patient Instructions (Signed)
We will increase the Ambien to 10 mg nightly. Take as directed. Continue your exercise regimen. Continue the Valium as needed. Follow-up with counseling as scheduled.  Please let me know if you reconsider starting another medication for anxiety. I think it would be beneficial. Follow-up in 2 months.

## 2015-01-31 ENCOUNTER — Ambulatory Visit (INDEPENDENT_AMBULATORY_CARE_PROVIDER_SITE_OTHER): Payer: Federal, State, Local not specified - PPO | Admitting: Psychology

## 2015-01-31 DIAGNOSIS — F4323 Adjustment disorder with mixed anxiety and depressed mood: Secondary | ICD-10-CM | POA: Diagnosis not present

## 2015-02-02 NOTE — Assessment & Plan Note (Signed)
Improving. We'll increase to Ambien 10 mg daily. Encourage p.m. exercise. Will follow-up in one month.

## 2015-02-02 NOTE — Assessment & Plan Note (Signed)
More so generalized anxiety present. Patient is not willing to start another medication at the moment. Is doing okay on Valium 2 mg PRN. Seeing counselor every 2 weeks. Will follow-up in one month.

## 2015-02-02 NOTE — Progress Notes (Signed)
Patient presents to clinic today for follow-up of anxiety and insomnia. Patient states the anxiety has improved somewhat. States she stopped Elavil at bedtime because it was making her excessively sleepy throughout the day when used with Ambien. She states she did not feel it was helping with her anxiety. Is taking her Valium as directed. Denies panic attack. Is following up with counseling services. Patient endorses some improvement in sleep with Ambien 5 mg. She endorses most nights she is getting 4-5 hours of sleep.   Past Medical History  Diagnosis Date  . Migraines   . Chicken pox as a child    X 2  . Kidney stones 7-12    X 3  . Diverticulosis 2008  . Colon polyps 2008  . GERD (gastroesophageal reflux disease) 2008  . Erosive inflammation of stomach lining 2008  . Obesity 10/08/2011  . Preventative health care 10/13/2011  . Foot pain, right 10/13/2011  . Ovarian cyst, right   . Bronchitis 11/05/2011  . Left shoulder pain 01/04/2012  . Skin tag 06/01/2012  . Cough 06/01/2012  . Diverticulosis 03/25/2013  . Solitary pulmonary nodule 03/25/2013  . Arthritis 06/18/2014    Current Outpatient Prescriptions on File Prior to Visit  Medication Sig Dispense Refill  . diazepam (VALIUM) 2 MG tablet Take 1 tablet (2 mg total) by mouth every 6 (six) hours as needed for anxiety. 30 tablet 0  . Probiotic Product (PROBIOTIC DAILY PO) Take by mouth daily.    . promethazine (PHENERGAN) 25 MG tablet Take 1 tablet (25 mg total) by mouth every 8 (eight) hours as needed for nausea or vomiting. 20 tablet 0  . ranitidine (ZANTAC) 150 MG tablet Take 1 tablet (150 mg total) by mouth 2 (two) times daily. 60 tablet 1  . rizatriptan (MAXALT) 10 MG tablet TAKE 1 TABLET (10 MG TOTAL) BY MOUTH AS NEEDED. MAY REPEAT IN 2 HOURS IF NEEDED 18 tablet 1  . VIORELE 0.15-0.02/0.01 MG (21/5) tablet TAKE 1 TABLET BY MOUTH DAILY. 28 tablet 6   No current facility-administered medications on file prior to visit.    No  Known Allergies  Family History  Problem Relation Age of Onset  . Hypertension Mother   . Cancer Mother     skin, melanoma  . Migraines Mother   . Heart disease Maternal Grandmother   . Colon cancer Maternal Grandmother   . Colon cancer Maternal Grandfather   . Colon cancer Maternal Grandfather   . Bipolar disorder Daughter     History   Social History  . Marital Status: Married    Spouse Name: Silva Aamodt  . Number of Children: 3  . Years of Education: Assoc degr   Occupational History  . human resources     [employer]    Social History Main Topics  . Smoking status: Never Smoker   . Smokeless tobacco: Never Used  . Alcohol Use: 1.5 oz/week    3 drink(s) per week     Comment: occasionally  . Drug Use: No  . Sexual Activity:    Partners: Male   Other Topics Concern  . None   Social History Narrative   Lives with her partner Domenica Weightman.          Review of Systems - See HPI.  All other ROS are negative.  BP 118/82 mmHg  Pulse 89  Temp(Src) 98.6 F (37 C) (Oral)  Resp 16  Ht  (1.575 m)  Wt 202 lb 2 oz (  91.683 kg)  BMI 36.96 kg/m2  SpO2 98%  LMP 01/10/2015  Physical Exam  Constitutional: She is oriented to person, place, and time and well-developed, well-nourished, and in no distress.  HENT:  Head: Normocephalic and atraumatic.  Cardiovascular: Normal rate, regular rhythm, normal heart sounds and intact distal pulses.   Pulmonary/Chest: Effort normal and breath sounds normal. No respiratory distress. She has no wheezes. She has no rales. She exhibits no tenderness.  Neurological: She is alert and oriented to person, place, and time.  Skin: Skin is warm and dry. No rash noted.  Psychiatric:  Anxious affect.  Vitals reviewed.   No results found for this or any previous visit (from the past 2160 hour(s)).  Assessment/Plan: Insomnia due to anxiety and fear Improving. We'll increase to Ambien 10 mg daily. Encourage p.m. exercise. Will follow-up in  one month.   Panic attack More so generalized anxiety present. Patient is not willing to start another medication at the moment. Is doing okay on Valium 2 mg PRN. Seeing counselor every 2 weeks. Will follow-up in one month.

## 2015-02-21 ENCOUNTER — Ambulatory Visit (INDEPENDENT_AMBULATORY_CARE_PROVIDER_SITE_OTHER): Payer: Federal, State, Local not specified - PPO | Admitting: Psychology

## 2015-02-21 DIAGNOSIS — F4323 Adjustment disorder with mixed anxiety and depressed mood: Secondary | ICD-10-CM | POA: Diagnosis not present

## 2015-02-23 ENCOUNTER — Telehealth: Payer: Self-pay | Admitting: *Deleted

## 2015-02-23 MED ORDER — ESCITALOPRAM OXALATE 10 MG PO TABS: ORAL_TABLET | ORAL | Status: DC

## 2015-02-23 NOTE — Telephone Encounter (Signed)
After speaking with Salomon Fickerri Bauert, MSW  [pt therapist/couselor] about pt needing to be placed on medication for anxiety/depression and her qualms about "gaining weight" and reviewing patient chart, provider [Martin] gave VO to send Lexapro 10 mg to patient's pharmacy to be taken 0.5 tab [5 mg] for [3] days, then increase to 1 tab [10 mg] thereafter daily. Patient informed, understood & agreed; Rx to pharmacy/SLS

## 2015-03-02 ENCOUNTER — Ambulatory Visit (INDEPENDENT_AMBULATORY_CARE_PROVIDER_SITE_OTHER): Payer: Federal, State, Local not specified - PPO | Admitting: Psychology

## 2015-03-02 DIAGNOSIS — F4323 Adjustment disorder with mixed anxiety and depressed mood: Secondary | ICD-10-CM

## 2015-03-21 ENCOUNTER — Ambulatory Visit: Payer: Federal, State, Local not specified - PPO | Admitting: Psychology

## 2015-04-03 ENCOUNTER — Ambulatory Visit (INDEPENDENT_AMBULATORY_CARE_PROVIDER_SITE_OTHER): Payer: Federal, State, Local not specified - PPO | Admitting: Family Medicine

## 2015-04-03 ENCOUNTER — Ambulatory Visit (HOSPITAL_BASED_OUTPATIENT_CLINIC_OR_DEPARTMENT_OTHER)
Admission: RE | Admit: 2015-04-03 | Discharge: 2015-04-03 | Disposition: A | Discharge status: Home / Self Care | Payer: Federal, State, Local not specified - PPO | Source: Ambulatory Visit | Attending: Family Medicine | Admitting: Family Medicine

## 2015-04-03 ENCOUNTER — Encounter: Payer: Self-pay | Admitting: Family Medicine

## 2015-04-03 VITALS — BP 122/86 | HR 77 | Temp 98.5°F | Ht 62.0 in | Wt 204.1 lb

## 2015-04-03 DIAGNOSIS — E782 Mixed hyperlipidemia: Secondary | ICD-10-CM | POA: Diagnosis not present

## 2015-04-03 DIAGNOSIS — R351 Nocturia: Secondary | ICD-10-CM | POA: Diagnosis not present

## 2015-04-03 DIAGNOSIS — K219 Gastro-esophageal reflux disease without esophagitis: Secondary | ICD-10-CM

## 2015-04-03 DIAGNOSIS — G4700 Insomnia, unspecified: Secondary | ICD-10-CM

## 2015-04-03 DIAGNOSIS — R197 Diarrhea, unspecified: Secondary | ICD-10-CM | POA: Insufficient documentation

## 2015-04-03 DIAGNOSIS — R1031 Right lower quadrant pain: Secondary | ICD-10-CM | POA: Diagnosis not present

## 2015-04-03 DIAGNOSIS — G44229 Chronic tension-type headache, not intractable: Secondary | ICD-10-CM

## 2015-04-03 DIAGNOSIS — R109 Unspecified abdominal pain: Secondary | ICD-10-CM | POA: Insufficient documentation

## 2015-04-03 DIAGNOSIS — E669 Obesity, unspecified: Secondary | ICD-10-CM

## 2015-04-03 DIAGNOSIS — F418 Other specified anxiety disorders: Secondary | ICD-10-CM

## 2015-04-03 DIAGNOSIS — K649 Unspecified hemorrhoids: Secondary | ICD-10-CM

## 2015-04-03 LAB — CBC
HCT: 40.7 % (ref 36.0–46.0)
Hemoglobin: 13.7 g/dL (ref 12.0–15.0)
MCHC: 33.7 g/dL (ref 30.0–36.0)
MCV: 87 fl (ref 78.0–100.0)
PLATELETS: 272 10*3/uL (ref 150.0–400.0)
RBC: 4.68 Mil/uL (ref 3.87–5.11)
RDW: 13.9 % (ref 11.5–15.5)
WBC: 7 10*3/uL (ref 4.0–10.5)

## 2015-04-03 LAB — COMPREHENSIVE METABOLIC PANEL
ALK PHOS: 69 U/L (ref 39–117)
ALT: 16 U/L (ref 0–35)
AST: 20 U/L (ref 0–37)
Albumin: 4 g/dL (ref 3.5–5.2)
BUN: 9 mg/dL (ref 6–23)
CHLORIDE: 104 meq/L (ref 96–112)
CO2: 25 mEq/L (ref 19–32)
CREATININE: 0.76 mg/dL (ref 0.40–1.20)
Calcium: 8.9 mg/dL (ref 8.4–10.5)
GFR: 87.54 mL/min (ref >60.00)
Glucose, Bld: 85 mg/dL (ref 70–99)
Potassium: 3.8 mEq/L (ref 3.5–5.1)
Sodium: 136 mEq/L (ref 135–145)
Total Bilirubin: 0.4 mg/dL (ref 0.2–1.2)
Total Protein: 6.9 g/dL (ref 6.0–8.3)

## 2015-04-03 LAB — LIPASE: Lipase: 21 U/L (ref 11.0–59.0)

## 2015-04-03 LAB — LIPID PANEL
Cholesterol: 196 mg/dL (ref 0–200)
HDL: 53.8 mg/dL (ref >39.00)
LDL Cholesterol: 110 mg/dL — ABNORMAL HIGH (ref 0–99)
NONHDL: 142.2
TRIGLYCERIDES: 160 mg/dL — AB (ref 0.0–149.0)
Total CHOL/HDL Ratio: 4
VLDL: 32 mg/dL (ref 0.0–40.0)

## 2015-04-03 LAB — SEDIMENTATION RATE: SED RATE: 21 mm/h (ref 0–22)

## 2015-04-03 LAB — TSH: TSH: 1.57 u[IU]/mL (ref 0.35–4.50)

## 2015-04-03 MED ORDER — TEMAZEPAM 15 MG PO CAPS: 15.0000 mg | ORAL_CAPSULE | Freq: Every evening | ORAL | Status: DC | PRN

## 2015-04-03 MED ORDER — ESCITALOPRAM OXALATE 10 MG PO TABS: 10.0000 mg | ORAL_TABLET | Freq: Every day | ORAL | Status: DC

## 2015-04-03 NOTE — Progress Notes (Signed)
Pre visit review using our clinic review tool, if applicable. No additional management support is needed unless otherwise documented below in the visit note. 

## 2015-04-03 NOTE — Patient Instructions (Addendum)
NOW company multistrain 10 probiotic, order at [email or web address]  Metamucil/Psyllium twice daily Or Benefiber twice daily Witch Hazel Astringent  Can try Melatonin 2-10 mg at bedtime  Hemorrhoids Hemorrhoids are swollen veins around the rectum or anus. There are two types of hemorrhoids:   Internal hemorrhoids. These occur in the veins just inside the rectum. They may poke through to the outside and become irritated and painful.  External hemorrhoids. These occur in the veins outside the anus and can be felt as a painful swelling or hard lump near the anus. CAUSES  Pregnancy.   Obesity.   Constipation or diarrhea.   Straining to have a bowel movement.   Sitting for long periods on the toilet.  Heavy lifting or other activity that caused you to strain.  Anal intercourse. SYMPTOMS   Pain.   Anal itching or irritation.   Rectal bleeding.   Fecal leakage.   Anal swelling.   One or more lumps around the anus.  DIAGNOSIS  Your caregiver may be able to diagnose hemorrhoids by visual examination. Other examinations or tests that may be performed include:   Examination of the rectal area with a gloved hand (digital rectal exam).   Examination of anal canal using a small tube (scope).   A blood test if you have lost a significant amount of blood.  A test to look inside the colon (sigmoidoscopy or colonoscopy). TREATMENT Most hemorrhoids can be treated at home. However, if symptoms do not seem to be getting better or if you have a lot of rectal bleeding, your caregiver may perform a procedure to help make the hemorrhoids get smaller or remove them completely. Possible treatments include:   Placing a rubber band at the base of the hemorrhoid to cut off the circulation (rubber band ligation).   Injecting a chemical to shrink the hemorrhoid (sclerotherapy).   Using a tool to burn the hemorrhoid (infrared light therapy).   Surgically removing the  hemorrhoid (hemorrhoidectomy).   Stapling the hemorrhoid to block blood flow to the tissue (hemorrhoid stapling).  HOME CARE INSTRUCTIONS   Eat foods with fiber, such as whole grains, beans, nuts, fruits, and vegetables. Ask your doctor about taking products with added fiber in them (fibersupplements).  Increase fluid intake. Drink enough water and fluids to keep your urine clear or pale yellow.   Exercise regularly.   Go to the bathroom when you have the urge to have a bowel movement. Do not wait.   Avoid straining to have bowel movements.   Keep the anal area dry and clean. Use wet toilet paper or moist towelettes after a bowel movement.   Medicated creams and suppositories may be used or applied as directed.   Only take over-the-counter or prescription medicines as directed by your caregiver.   Take warm sitz baths for 15-20 minutes, 3-4 times a day to ease pain and discomfort.   Place ice packs on the hemorrhoids if they are tender and swollen. Using ice packs between sitz baths may be helpful.   Put ice in a plastic bag.   Place a towel between your skin and the bag.   Leave the ice on for 15-20 minutes, 3-4 times a day.   Do not use a donut-shaped pillow or sit on the toilet for long periods. This increases blood pooling and pain.  SEEK MEDICAL CARE IF:  You have increasing pain and swelling that is not controlled by treatment or medicine.  You have uncontrolled bleeding.  You have difficulty  or you are unable to have a bowel movement.  You have pain or inflammation outside the area of the hemorrhoids. MAKE SURE YOU:  Understand these instructions.  Will watch your condition.  Will get help right away if you are not doing well or get worse. Document Released: 10/17/2000 Document Revised: 10/06/2012 Document Reviewed: 08/24/2012 [facility] Patient Information 2015 [place], [place]. This information is not intended to replace advice given to you by  your health care provider. Make sure you discuss any questions you have with your health care provider.

## 2015-04-03 NOTE — Progress Notes (Signed)
Lisa Ray  [id number] 08/13/1971 04/03/2015      Progress Note-Follow Up  Subjective  Chief Complaint  Chief Complaint  Patient presents with  . Follow-up    HPI  Patient is a 44 y.o. female in today for routine medical care.  Patient is in today for follow-up. Is frustrated with recent kidney stone about 2 months ago. Symptoms better at this time. Is walking and trying to eat better but has trouble losing weight. Has chronic back pain. Ambien is helping her fall sleep but she does not remain sleep. She does acknowledge she snores but does not stop breathing. Her depression and anxiety are improving with counseling and medication changes. Denies CP/palp/SOB/HA/congestion/fevers/GI or GU c/o. Taking meds as prescribed  Past Medical History  Diagnosis Date  . Migraines   . Chicken pox as a child    X 2  . Kidney stones 7-12    X 3  . Diverticulosis 2008  . Colon polyps 2008  . GERD (gastroesophageal reflux disease) 2008  . Erosive inflammation of stomach lining 2008  . Obesity 10/08/2011  . Preventative health care 10/13/2011  . Foot pain, right 10/13/2011  . Ovarian cyst, right   . Bronchitis 11/05/2011  . Left shoulder pain 01/04/2012  . Skin tag 06/01/2012  . Cough 06/01/2012  . Diverticulosis 03/25/2013  . Solitary pulmonary nodule 03/25/2013  . Arthritis 06/18/2014    Past Surgical History  Procedure Laterality Date  . Lithotripsy  2008  . Tonsilectomy, adenoidectomy, bilateral myringotomy and tubes      Age 72  . Stent for kidney stone  7-12  . Ovarian cyst removal      Family History  Problem Relation Age of Onset  . Hypertension Mother   . Cancer Mother     skin, melanoma  . Migraines Mother   . Heart disease Maternal Grandmother   . Colon cancer Maternal Grandmother   . Colon cancer Maternal Grandfather   . Colon cancer Maternal Grandfather   . Bipolar disorder Daughter     History   Social History  . Marital Status: Married    Spouse Name:  Jasamine Pottinger  . Number of Children: 3  . Years of Education: Assoc degr   Occupational History  . human resources     [employer]    Social History Main Topics  . Smoking status: Never Smoker   . Smokeless tobacco: Never Used  . Alcohol Use: 1.5 oz/week    3 drink(s) per week     Comment: occasionally  . Drug Use: No  . Sexual Activity:    Partners: Male   Other Topics Concern  . Not on file   Social History Narrative   Lives with her partner Maresa Morash.           Current Outpatient Prescriptions on File Prior to Visit  Medication Sig Dispense Refill  . diazepam (VALIUM) 2 MG tablet Take 1 tablet (2 mg total) by mouth every 6 (six) hours as needed for anxiety. 30 tablet 0  . escitalopram (LEXAPRO) 10 MG tablet Take 0.5 tablet [5 mg total] for three days, then begin taking 1 tablet [10 mg total] by mouth daily 30 tablet 2  . Probiotic Product (PROBIOTIC DAILY PO) Take by mouth daily.    . ranitidine (ZANTAC) 150 MG tablet Take 1 tablet (150 mg total) by mouth 2 (two) times daily. 60 tablet 1  . rizatriptan (MAXALT) 10 MG tablet TAKE 1 TABLET (10 MG TOTAL)  BY MOUTH AS NEEDED. MAY REPEAT IN 2 HOURS IF NEEDED 18 tablet 1  . VIORELE 0.15-0.02/0.01 MG (21/5) tablet TAKE 1 TABLET BY MOUTH DAILY. 28 tablet 6  . zolpidem (AMBIEN) 10 MG tablet Take 1 tablet (10 mg total) by mouth at bedtime as needed for sleep. 30 tablet 0  . promethazine (PHENERGAN) 25 MG tablet Take 1 tablet (25 mg total) by mouth every 8 (eight) hours as needed for nausea or vomiting. (Patient not taking: Reported on 04/03/2015) 20 tablet 0   No current facility-administered medications on file prior to visit.    No Known Allergies  Review of Systems  Review of Systems  Constitutional: Negative for fever and malaise/fatigue.  HENT: Negative for congestion.   Eyes: Negative for discharge.  Respiratory: Negative for shortness of breath.   Cardiovascular: Negative for chest pain, palpitations and leg swelling.    Gastrointestinal: Negative for nausea, abdominal pain and diarrhea.  Genitourinary: Negative for dysuria.  Musculoskeletal: Negative for falls.  Skin: Negative for rash.  Neurological: Negative for loss of consciousness and headaches.  Endo/Heme/Allergies: Negative for polydipsia.  Psychiatric/Behavioral: Positive for depression. Negative for suicidal ideas. The patient is nervous/anxious. The patient does not have insomnia.     Objective  BP 122/86 mmHg  Pulse 77  Temp(Src) 98.5 F (36.9 C) (Oral)  Ht 5\' 2"  (1.575 m)  Wt 204 lb 2 oz (92.59 kg)  BMI 37.33 kg/m2  SpO2 96%  LMP 03/03/2015  Physical Exam  Physical Exam  Constitutional: She is oriented to person, place, and time and well-developed, well-nourished, and in no distress. No distress.  HENT:  Head: Normocephalic and atraumatic.  Eyes: Conjunctivae are normal.  Neck: Neck supple. No thyromegaly present.  Cardiovascular: Normal rate, regular rhythm and normal heart sounds.   No murmur heard. Pulmonary/Chest: Effort normal and breath sounds normal. She has no wheezes.  Abdominal: She exhibits no distension and no mass.  Musculoskeletal: She exhibits no edema.  Lymphadenopathy:    She has no cervical adenopathy.  Neurological: She is alert and oriented to person, place, and time.  Skin: Skin is warm and dry. No rash noted. She is not diaphoretic.  Psychiatric: Memory, affect and judgment normal.    Lab Results  Component Value Date   TSH 2.101 06/15/2014   Lab Results  Component Value Date   WBC 7.0 06/15/2014   HGB 12.4 06/15/2014   HCT 37.2 06/15/2014   MCV 78.6 06/15/2014   PLT 282 06/15/2014   Lab Results  Component Value Date   CREATININE 0.84 06/15/2014   BUN 12 06/15/2014   NA 136 06/15/2014   K 4.4 06/15/2014   CL 104 06/15/2014   CO2 24 06/15/2014   Lab Results  Component Value Date   ALT 11 10/08/2011   AST 17 10/08/2011   ALKPHOS 76 10/08/2011   BILITOT 0.4 10/08/2011   Lab Results   Component Value Date   CHOL 206* 06/15/2014   Lab Results  Component Value Date   HDL 52 06/15/2014   Lab Results  Component Value Date   LDLCALC 108* 06/15/2014   Lab Results  Component Value Date   TRIG 228* 06/15/2014   Lab Results  Component Value Date   CHOLHDL 4.0 06/15/2014     Assessment & Plan  GERD (gastroesophageal reflux disease) Avoid offending foods, start probiotics. Do not eat large meals in late evening and consider raising head of bed.    Chronic tension headaches Encouraged increased hydration, 64 ounces  of clear fluids daily. Minimize alcohol and caffeine. Eat small frequent meals with lean proteins and complex carbs. Avoid high and low blood sugars. Get adequate sleep, 7-8 hours a night. Needs exercise daily preferably in the morning.   Hyperlipidemia, mixed Encouraged heart healthy diet, increase exercise, avoid trans fats, consider a krill oil cap daily   Obesity Encouraged DASH diet, decrease po intake and increase exercise as tolerated. Needs 7-8 hours of sleep nightly. Avoid trans fats, eat small, frequent meals every 4-5 hours with lean proteins, complex carbs and healthy fats. Minimize simple carbs, GMO foods.   Depression with anxiety Improving with therapy and medication changes. No further changes today

## 2015-04-04 ENCOUNTER — Other Ambulatory Visit: Payer: Self-pay | Admitting: Family Medicine

## 2015-04-04 LAB — URINALYSIS, ROUTINE W REFLEX MICROSCOPIC
Bilirubin Urine: NEGATIVE
KETONES UR: NEGATIVE
Leukocytes, UA: NEGATIVE
Nitrite: NEGATIVE
Specific Gravity, Urine: >=1.03 — AB (ref 1.000–1.030)
Total Protein, Urine: NEGATIVE
URINE GLUCOSE: NEGATIVE
UROBILINOGEN UA: 0.2 (ref 0.0–1.0)
pH: 5.5 (ref 5.0–8.0)

## 2015-04-05 ENCOUNTER — Other Ambulatory Visit: Payer: Self-pay | Admitting: Family Medicine

## 2015-04-05 DIAGNOSIS — N200 Calculus of kidney: Secondary | ICD-10-CM

## 2015-04-05 LAB — URINE CULTURE: Colony Count: 50000

## 2015-04-05 LAB — CLOSTRIDIUM DIFFICILE BY PCR: Toxigenic C. Difficile by PCR: NOT DETECTED

## 2015-04-05 LAB — FECAL LACTOFERRIN, QUANT: Lactoferrin: NEGATIVE

## 2015-04-06 ENCOUNTER — Other Ambulatory Visit: Payer: Self-pay | Admitting: Family Medicine

## 2015-04-06 LAB — OVA AND PARASITE EXAMINATION: OP: NONE SEEN

## 2015-04-06 MED ORDER — NITROFURANTOIN MONOHYD MACRO 100 MG PO CAPS: 100.0000 mg | ORAL_CAPSULE | Freq: Two times a day (BID) | ORAL | Status: DC

## 2015-04-08 ENCOUNTER — Encounter: Payer: Self-pay | Admitting: Family Medicine

## 2015-04-08 DIAGNOSIS — F418 Other specified anxiety disorders: Secondary | ICD-10-CM

## 2015-04-08 HISTORY — DX: Other specified anxiety disorders: F41.8

## 2015-04-08 LAB — STOOL CULTURE

## 2015-04-08 NOTE — Assessment & Plan Note (Signed)
Improving with therapy and medication changes. No further changes today

## 2015-04-08 NOTE — Assessment & Plan Note (Signed)
Avoid offending foods, start probiotics. Do not eat large meals in late evening and consider raising head of bed.  

## 2015-04-08 NOTE — Assessment & Plan Note (Signed)
Encouraged DASH diet, decrease po intake and increase exercise as tolerated. Needs 7-8 hours of sleep nightly. Avoid trans fats, eat small, frequent meals every 4-5 hours with lean proteins, complex carbs and healthy fats. Minimize simple carbs, GMO foods. 

## 2015-04-08 NOTE — Assessment & Plan Note (Signed)
Encouraged increased hydration, 64 ounces of clear fluids daily. Minimize alcohol and caffeine. Eat small frequent meals with lean proteins and complex carbs. Avoid high and low blood sugars. Get adequate sleep, 7-8 hours a night. Needs exercise daily preferably in the morning.  

## 2015-04-08 NOTE — Assessment & Plan Note (Signed)
Encouraged heart healthy diet, increase exercise, avoid trans fats, consider a krill oil cap daily 

## 2015-04-13 ENCOUNTER — Encounter: Payer: Self-pay | Admitting: Family Medicine

## 2015-04-13 ENCOUNTER — Telehealth: Payer: Self-pay | Admitting: Family Medicine

## 2015-04-13 NOTE — Telephone Encounter (Signed)
Caller name: Chenille Parde Relationship to patient: self Can be reached: [phone] [facility]  Reason for call: Pt states that she got a yeast infection from the antibiotics prescribed. Please call in a med for her for the yeast infection.

## 2015-04-15 NOTE — Telephone Encounter (Signed)
OK to send in Diflucan 150 mg tab 1 tab po weekly x 2 weeks

## 2015-04-16 MED ORDER — FLUCONAZOLE 150 MG PO TABS: ORAL_TABLET | ORAL | Status: DC

## 2015-04-16 NOTE — Telephone Encounter (Signed)
Prescription sent [facility] to [facility] as requested/instructed.  Patient informed

## 2015-04-27 ENCOUNTER — Encounter: Payer: Self-pay | Admitting: Nurse Practitioner

## 2015-05-04 ENCOUNTER — Other Ambulatory Visit: Payer: Self-pay | Admitting: Family Medicine

## 2015-05-08 ENCOUNTER — Ambulatory Visit: Payer: Self-pay | Admitting: Nurse Practitioner

## 2015-06-01 ENCOUNTER — Telehealth: Payer: Self-pay | Admitting: *Deleted

## 2015-06-01 ENCOUNTER — Ambulatory Visit (INDEPENDENT_AMBULATORY_CARE_PROVIDER_SITE_OTHER): Payer: Federal, State, Local not specified - PPO | Admitting: Family Medicine

## 2015-06-01 ENCOUNTER — Ambulatory Visit (HOSPITAL_BASED_OUTPATIENT_CLINIC_OR_DEPARTMENT_OTHER)
Admission: RE | Admit: 2015-06-01 | Discharge: 2015-06-01 | Disposition: A | Discharge status: Home / Self Care | Payer: Federal, State, Local not specified - PPO | Source: Ambulatory Visit | Attending: Family Medicine | Admitting: Family Medicine

## 2015-06-01 VITALS — BP 128/76 | HR 96 | Temp 98.6°F | Resp 18 | Ht 62.0 in | Wt 206.2 lb

## 2015-06-01 DIAGNOSIS — F409 Phobic anxiety disorder, unspecified: Secondary | ICD-10-CM

## 2015-06-01 DIAGNOSIS — R059 Cough, unspecified: Secondary | ICD-10-CM

## 2015-06-01 DIAGNOSIS — E669 Obesity, unspecified: Secondary | ICD-10-CM | POA: Diagnosis not present

## 2015-06-01 DIAGNOSIS — R05 Cough: Secondary | ICD-10-CM

## 2015-06-01 DIAGNOSIS — K219 Gastro-esophageal reflux disease without esophagitis: Secondary | ICD-10-CM | POA: Diagnosis not present

## 2015-06-01 DIAGNOSIS — F5105 Insomnia due to other mental disorder: Secondary | ICD-10-CM

## 2015-06-01 DIAGNOSIS — R079 Chest pain, unspecified: Secondary | ICD-10-CM | POA: Diagnosis not present

## 2015-06-01 MED ORDER — ALBUTEROL SULFATE HFA 108 (90 BASE) MCG/ACT IN AERS: 1.0000 | INHALATION_SPRAY | Freq: Four times a day (QID) | RESPIRATORY_TRACT | Status: DC | PRN

## 2015-06-01 MED ORDER — RANITIDINE HCL 300 MG PO TABS: 300.0000 mg | ORAL_TABLET | Freq: Every day | ORAL | Status: DC

## 2015-06-01 MED ORDER — BENZONATATE 100 MG PO CAPS: 100.0000 mg | ORAL_CAPSULE | Freq: Three times a day (TID) | ORAL | Status: DC | PRN

## 2015-06-01 MED ORDER — OMEPRAZOLE 20 MG PO TBEC: 20.0000 mg | DELAYED_RELEASE_TABLET | Freq: Every day | ORAL | Status: DC

## 2015-06-01 MED ORDER — METHYLPREDNISOLONE 4 MG PO TBPK: ORAL_TABLET | ORAL | Status: DC

## 2015-06-01 MED ORDER — ZOLPIDEM TARTRATE 10 MG PO TABS: 10.0000 mg | ORAL_TABLET | Freq: Every evening | ORAL | Status: DC | PRN

## 2015-06-01 NOTE — Patient Instructions (Signed)

## 2015-06-01 NOTE — Telephone Encounter (Signed)
Patient returned her Temazepam  - did not work well for her so she brought pills back to office visit.    Counted with Dr. Abner Greenspan and 27 pills were left in bottle.   Secured with Dr. Abner Greenspan.

## 2015-06-01 NOTE — Progress Notes (Signed)
Pre visit review using our clinic review tool, if applicable. No additional management support is needed unless otherwise documented below in the visit note. 

## 2015-06-08 ENCOUNTER — Telehealth: Payer: Self-pay | Admitting: Family Medicine

## 2015-06-08 NOTE — Telephone Encounter (Signed)
Caller name: Sharyl Nimrod Relationship to patient: Can be reached: Pharmacy: [facility]   Reason for call: Pharmacy would like to have authorization (per insurance) to refill the pt's [place] Rx. She has faxed over the prior authorization form.

## 2015-06-10 ENCOUNTER — Encounter: Payer: Self-pay | Admitting: Family Medicine

## 2015-06-10 DIAGNOSIS — R059 Cough, unspecified: Secondary | ICD-10-CM | POA: Insufficient documentation

## 2015-06-10 DIAGNOSIS — R05 Cough: Secondary | ICD-10-CM | POA: Insufficient documentation

## 2015-06-10 NOTE — Assessment & Plan Note (Signed)
Encouraged DASH diet, decrease po intake and increase exercise as tolerated. Needs 7-8 hours of sleep nightly. Avoid trans fats, eat small, frequent meals every 4-5 hours with lean proteins, complex carbs and healthy fats. Minimize simple carbs, GMO foods. 

## 2015-06-10 NOTE — Assessment & Plan Note (Signed)
Avoid offending foods, take probiotics. Do not eat large meals in late evening and consider raising head of bed. May need to consider possibility that this is contributing to cough and refer for upper endoscopy if cough does not improve. Continue acid suppression for now.

## 2015-06-10 NOTE — Progress Notes (Signed)
Lisa Ray  [id number] 1971-10-20 06/10/2015      Progress Note-Follow Up  Subjective  Chief Complaint  Chief Complaint  Patient presents with  . Cough    x4 weeks, dry cough, no fevers, no sore throat- nagging, has trouble sleeping    HPI  Patient is a 44 y.o. female in today for routine medical care. atient has been struggling with cough No fevers or chills but cough is present day and night. No chest pain or palpitations. No SOB except when coughing. Denies CP/palp/SOB/HA/fevers/GI or GU c/o. Taking meds as prescribed  Past Medical History  Diagnosis Date  . Migraines   . Chicken pox as a child    X 2  . Kidney stones 7-12    X 3  . Diverticulosis 2008  . Colon polyps 2008  . GERD (gastroesophageal reflux disease) 2008  . Erosive inflammation of stomach lining 2008  . Obesity 10/08/2011  . Preventative health care 10/13/2011  . Foot pain, right 10/13/2011  . Ovarian cyst, right   . Bronchitis 11/05/2011  . Left shoulder pain 01/04/2012  . Skin tag 06/01/2012  . Cough 06/01/2012  . Diverticulosis 03/25/2013  . Solitary pulmonary nodule 03/25/2013  . Arthritis 06/18/2014  . Depression with anxiety 04/08/2015    Past Surgical History  Procedure Laterality Date  . Lithotripsy  2008  . Tonsilectomy, adenoidectomy, bilateral myringotomy and tubes      Age 39  . Stent for kidney stone  7-12  . Ovarian cyst removal      Family History  Problem Relation Age of Onset  . Hypertension Mother   . Cancer Mother     skin, melanoma  . Migraines Mother   . Heart disease Maternal Grandmother   . Colon cancer Maternal Grandmother   . Colon cancer Maternal Grandfather   . Colon cancer Maternal Grandfather   . Bipolar disorder Daughter     History   Social History  . Marital Status: Married    Spouse Name: Rasa Degrazia  . Number of Children: 3  . Years of Education: Assoc degr   Occupational History  . human resources     [employer]    Social History Main Topics  .  Smoking status: Never Smoker   . Smokeless tobacco: Never Used  . Alcohol Use: 1.5 oz/week    3 drink(s) per week     Comment: occasionally  . Drug Use: No  . Sexual Activity:    Partners: Male   Other Topics Concern  . Not on file   Social History Narrative   Lives with her partner Joany Khatib.           Current Outpatient Prescriptions on File Prior to Visit  Medication Sig Dispense Refill  . Probiotic Product (PROBIOTIC DAILY PO) Take by mouth daily.    . promethazine (PHENERGAN) 25 MG tablet Take 1 tablet (25 mg total) by mouth every 8 (eight) hours as needed for nausea or vomiting. 20 tablet 0  . rizatriptan (MAXALT) 10 MG tablet TAKE 1 TABLET (10 MG TOTAL) BY MOUTH AS NEEDED MAY REPEAT IN 2 HOURS IF NEEDED 18 tablet 1  . temazepam (RESTORIL) 15 MG capsule Take 1 capsule (15 mg total) by mouth at bedtime as needed for sleep. 30 capsule 2  . VIORELE 0.15-0.02/0.01 MG (21/5) tablet TAKE 1 TABLET BY MOUTH DAILY. 28 tablet 6  . diazepam (VALIUM) 2 MG tablet Take 1 tablet (2 mg total) by mouth every 6 (six)  hours as needed for anxiety. (Patient not taking: Reported on 06/01/2015) 30 tablet 0  . escitalopram (LEXAPRO) 10 MG tablet Take 1 tablet (10 mg total) by mouth at bedtime. (Patient not taking: Reported on 06/01/2015) 30 tablet 5   No current facility-administered medications on file prior to visit.    No Known Allergies  Review of Systems  Review of Systems  Constitutional: Negative for fever and malaise/fatigue.  HENT: Positive for congestion.   Eyes: Negative for discharge.  Respiratory: Positive for cough. Negative for shortness of breath.   Cardiovascular: Negative for chest pain, palpitations and leg swelling.  Gastrointestinal: Positive for heartburn. Negative for nausea, abdominal pain and diarrhea.  Genitourinary: Negative for dysuria.  Musculoskeletal: Negative for falls.  Skin: Negative for rash.  Neurological: Negative for loss of consciousness and  headaches.  Endo/Heme/Allergies: Negative for polydipsia.  Psychiatric/Behavioral: Negative for depression and suicidal ideas. The patient is not nervous/anxious and does not have insomnia.     Objective  BP 128/76 mmHg  Pulse 96  Temp(Src) 98.6 F (37 C) (Oral)  Resp 18  Ht 5\' 2"  (1.575 m)  Wt 206 lb 3.2 oz (93.532 kg)  BMI 37.71 kg/m2  SpO2 98%  Physical Exam  Physical Exam  Constitutional: She is oriented to person, place, and time and well-developed, well-nourished, and in no distress. No distress.  HENT:  Head: Normocephalic and atraumatic.  Eyes: Conjunctivae are normal.  Neck: Neck supple. No thyromegaly present.  Cardiovascular: Normal rate, regular rhythm and normal heart sounds.   No murmur heard. Pulmonary/Chest: Effort normal and breath sounds normal. She has no wheezes.  Coughs with deep breath  Abdominal: Soft. Bowel sounds are normal. She exhibits no distension and no mass.  Musculoskeletal: She exhibits no edema.  Lymphadenopathy:    She has no cervical adenopathy.  Neurological: She is alert and oriented to person, place, and time.  Skin: Skin is warm and dry. No rash noted. She is not diaphoretic.  Psychiatric: Memory, affect and judgment normal.    Lab Results  Component Value Date   TSH 1.57 04/03/2015   Lab Results  Component Value Date   WBC 7.0 04/03/2015   HGB 13.7 04/03/2015   HCT 40.7 04/03/2015   MCV 87.0 04/03/2015   PLT 272.0 04/03/2015   Lab Results  Component Value Date   CREATININE 0.76 04/03/2015   BUN 9 04/03/2015   NA 136 04/03/2015   K 3.8 04/03/2015   CL 104 04/03/2015   CO2 25 04/03/2015   Lab Results  Component Value Date   ALT 16 04/03/2015   AST 20 04/03/2015   ALKPHOS 69 04/03/2015   BILITOT 0.4 04/03/2015   Lab Results  Component Value Date   CHOL 196 04/03/2015   Lab Results  Component Value Date   HDL 53.80 04/03/2015   Lab Results  Component Value Date   LDLCALC 110* 04/03/2015   Lab Results    Component Value Date   TRIG 160.0* 04/03/2015   Lab Results  Component Value Date   CHOLHDL 4 04/03/2015     Assessment & Plan  Obesity Encouraged DASH diet, decrease po intake and increase exercise as tolerated. Needs 7-8 hours of sleep nightly. Avoid trans fats, eat small, frequent meals every 4-5 hours with lean proteins, complex carbs and healthy fats. Minimize simple carbs, GMO foods.  GERD (gastroesophageal reflux disease) Avoid offending foods, take probiotics. Do not eat large meals in late evening and consider raising head of bed. May need  to consider possibility that this is contributing to cough and refer for upper endoscopy if cough does not improve. Continue acid suppression for now.  Cough Referred to ENT for exam. CXR unremarkable today. If symptoms do not resolve will need upper endoscopy and CT chest. Use Mucinex, fluids, rest, vitamin C and aged garlic to treat viral component.   Insomnia due to anxiety and fear Refilled Ambien works better than Restoril. Encouraged good sleep hygiene such as dark, quiet room. No blue/green glowing lights such as computer screens in bedroom. No alcohol or stimulants in evening. Cut down on caffeine as able. Regular exercise is helpful but not just prior to bed time.

## 2015-06-10 NOTE — Assessment & Plan Note (Signed)
Referred to ENT for exam. CXR unremarkable today. If symptoms do not resolve will need upper endoscopy and CT chest. Use Mucinex, fluids, rest, vitamin C and aged garlic to treat viral component.

## 2015-06-10 NOTE — Assessment & Plan Note (Signed)
Refilled Ambien works better than Restoril. Encouraged good sleep hygiene such as dark, quiet room. No blue/green glowing lights such as computer screens in bedroom. No alcohol or stimulants in evening. Cut down on caffeine as able. Regular exercise is helpful but not just prior to bed time.

## 2015-06-11 NOTE — Telephone Encounter (Signed)
Approved 04/12/15 - 06/10/16. JG//CMA

## 2015-06-28 ENCOUNTER — Telehealth: Payer: Self-pay | Admitting: *Deleted

## 2015-06-28 NOTE — Telephone Encounter (Signed)
Opened in error

## 2015-07-19 ENCOUNTER — Ambulatory Visit: Payer: Self-pay | Admitting: Medical

## 2015-07-20 ENCOUNTER — Ambulatory Visit (INDEPENDENT_AMBULATORY_CARE_PROVIDER_SITE_OTHER): Payer: Federal, State, Local not specified - PPO | Admitting: Physician Assistant

## 2015-07-20 ENCOUNTER — Ambulatory Visit (HOSPITAL_BASED_OUTPATIENT_CLINIC_OR_DEPARTMENT_OTHER)
Admission: RE | Admit: 2015-07-20 | Discharge: 2015-07-20 | Disposition: A | Discharge status: Home / Self Care | Payer: Federal, State, Local not specified - PPO | Source: Ambulatory Visit | Attending: Physician Assistant | Admitting: Physician Assistant

## 2015-07-20 ENCOUNTER — Encounter: Payer: Self-pay | Admitting: Physician Assistant

## 2015-07-20 VITALS — BP 110/86 | HR 90 | Temp 98.2°F | Resp 16 | Ht 62.0 in | Wt 205.5 lb

## 2015-07-20 DIAGNOSIS — M5136 Other intervertebral disc degeneration, lumbar region: Secondary | ICD-10-CM | POA: Diagnosis not present

## 2015-07-20 DIAGNOSIS — M5440 Lumbago with sciatica, unspecified side: Secondary | ICD-10-CM

## 2015-07-20 DIAGNOSIS — M5430 Sciatica, unspecified side: Secondary | ICD-10-CM | POA: Diagnosis not present

## 2015-07-20 DIAGNOSIS — M545 Low back pain: Secondary | ICD-10-CM | POA: Insufficient documentation

## 2015-07-20 MED ORDER — METHYLPREDNISOLONE 4 MG PO TBPK: ORAL_TABLET | ORAL | Status: DC

## 2015-07-20 MED ORDER — HYDROCODONE-ACETAMINOPHEN 10-325 MG PO TABS: 1.0000 | ORAL_TABLET | Freq: Three times a day (TID) | ORAL | Status: DC | PRN

## 2015-07-20 NOTE — Progress Notes (Signed)
Patient presents to clinic today c/o gradual worsening chronic low back pain that is usually non-radiating and described as aching. Over the past week pain has been more severe and radiating into thigh and lower leg. Denies numbness, tingling or weakness of extremity. Denies trauma or injury. Sits for 8+ hours per day at work and pain is worse with sitting.  Past Medical History  Diagnosis Date  . Migraines   . Chicken pox as a child    X 2  . Kidney stones 7-12    X 3  . Diverticulosis 2008  . Colon polyps 2008  . GERD (gastroesophageal reflux disease) 2008  . Erosive inflammation of stomach lining 2008  . Obesity 10/08/2011  . Preventative health care 10/13/2011  . Foot pain, right 10/13/2011  . Ovarian cyst, right   . Bronchitis 11/05/2011  . Left shoulder pain 01/04/2012  . Skin tag 06/01/2012  . Cough 06/01/2012  . Diverticulosis 03/25/2013  . Solitary pulmonary nodule 03/25/2013  . Arthritis 06/18/2014  . Depression with anxiety 04/08/2015    Current Outpatient Prescriptions on File Prior to Visit  Medication Sig Dispense Refill  . albuterol (PROVENTIL HFA;VENTOLIN HFA) 108 (90 BASE) MCG/ACT inhaler Inhale 1-2 puffs into the lungs every 6 (six) hours as needed for shortness of breath. cough 18 g 1  . benzonatate (TESSALON PERLES) 100 MG capsule Take 1-2 capsules (100-200 mg total) by mouth 3 (three) times daily as needed for cough. 40 capsule 1  . diazepam (VALIUM) 2 MG tablet Take 1 tablet (2 mg total) by mouth every 6 (six) hours as needed for anxiety. 30 tablet 0  . escitalopram (LEXAPRO) 10 MG tablet Take 1 tablet (10 mg total) by mouth at bedtime. 30 tablet 5  . Omeprazole 20 MG TBEC Take 1 tablet (20 mg total) by mouth daily. 30 each 0  . Probiotic Product (PROBIOTIC DAILY PO) Take by mouth daily.    . promethazine (PHENERGAN) 25 MG tablet Take 1 tablet (25 mg total) by mouth every 8 (eight) hours as needed for nausea or vomiting. 20 tablet 0  . ranitidine (ZANTAC) 300 MG  tablet Take 1 tablet (300 mg total) by mouth at bedtime. 30 tablet 1  . rizatriptan (MAXALT) 10 MG tablet TAKE 1 TABLET (10 MG TOTAL) BY MOUTH AS NEEDED MAY REPEAT IN 2 HOURS IF NEEDED 18 tablet 1  . VIORELE 0.15-0.02/0.01 MG (21/5) tablet TAKE 1 TABLET BY MOUTH DAILY. 28 tablet 6  . zolpidem (AMBIEN) 10 MG tablet Take 1 tablet (10 mg total) by mouth at bedtime as needed for sleep. 30 tablet 2   No current facility-administered medications on file prior to visit.    No Known Allergies  Family History  Problem Relation Age of Onset  . Hypertension Mother   . Cancer Mother     skin, melanoma  . Migraines Mother   . Heart disease Maternal Grandmother   . Colon cancer Maternal Grandmother   . Colon cancer Maternal Grandfather   . Colon cancer Maternal Grandfather   . Bipolar disorder Daughter     Social History   Social History  . Marital Status: Married    Spouse Name: Genavie Boettger  . Number of Children: 3  . Years of Education: Assoc degr   Occupational History  . human resources     [employer]    Social History Main Topics  . Smoking status: Never Smoker   . Smokeless tobacco: Never Used  . Alcohol Use: 1.5 oz/week  3 drink(s) per week     Comment: occasionally  . Drug Use: No  . Sexual Activity:    Partners: Male   Other Topics Concern  . None   Social History Narrative   Lives with her partner Suheily Birks.          Review of Systems - See HPI.  All other ROS are negative.  BP 110/86 mmHg  Pulse 90  Temp(Src) 98.2 F (36.8 C) (Oral)  Resp 16  Ht 5\' 2"  (1.575 m)  Wt 205 lb 8 oz (93.214 kg)  BMI 37.58 kg/m2  SpO2 98%  LMP 07/19/2015  Physical Exam  Constitutional: She is oriented to person, place, and time.  Cardiovascular: Normal rate, regular rhythm, normal heart sounds and intact distal pulses.   Pulmonary/Chest: Effort normal and breath sounds normal. No respiratory distress. She has no wheezes. She has no rales. She exhibits no tenderness.    Musculoskeletal:       Right hip: She exhibits normal range of motion, normal strength and no tenderness.       Cervical back: Normal.       Thoracic back: Normal.       Lumbar back: She exhibits pain. She exhibits no tenderness and no bony tenderness.  Neurological: She is alert and oriented to person, place, and time.  Vitals reviewed.   No results found for this or any previous visit (from the past 2160 hour(s)).  Assessment/Plan: Lumbago of lumbar region with sciatica Giving chronicity, will obtain x-ray lumbar spine. Rx medrol dose pack. Norco for severe breakthrough pain. Supportive measures and exercises discussed. Handout given. Note written to work supervisors for ergonomic chair to help with posture and pain. Follow-up 4 weeks.

## 2015-07-20 NOTE — Progress Notes (Signed)
Pre visit review using our clinic review tool, if applicable. No additional management support is needed unless otherwise documented below in the visit note/SLS  

## 2015-07-20 NOTE — Assessment & Plan Note (Signed)
Giving chronicity, will obtain x-ray lumbar spine. Rx medrol dose pack. Norco for severe breakthrough pain. Supportive measures and exercises discussed. Handout given. Note written to work supervisors for ergonomic chair to help with posture and pain. Follow-up 4 weeks.

## 2015-07-20 NOTE — Patient Instructions (Signed)
Please go downstairs for x-ray. I will call you with your results. Please take the Medrol pack as directed. Limit heavy lifting or overexertion. Use Norco as directed if needed for severe pain.  Once pain starts subsiding, please start exercises below. Turn in the prescription for the work chair ASAP.  Sciatica with Rehab The sciatic nerve runs from the back down the leg and is responsible for sensation and control of the muscles in the back (posterior) side of the thigh, lower leg, and foot. Sciatica is a condition that is characterized by inflammation of this nerve.  SYMPTOMS   Signs of nerve damage, including numbness and/or weakness along the posterior side of the lower extremity.  Pain in the back of the thigh that may also travel down the leg.  Pain that worsens when sitting for long periods of time.  Occasionally, pain in the back or buttock. CAUSES  Inflammation of the sciatic nerve is the cause of sciatica. The inflammation is due to something irritating the nerve. Common sources of irritation include:  Sitting for long periods of time.  Direct trauma to the nerve.  Arthritis of the spine.  Herniated or ruptured disk.  Slipping of the vertebrae (spondylolisthesis).  Pressure from soft tissues, such as muscles or ligament-like tissue (fascia). RISK INCREASES WITH:  Sports that place pressure or stress on the spine (football or weightlifting).  Poor strength and flexibility.  Failure to warm up properly before activity.  Family history of low back pain or disk disorders.  Previous back injury or surgery.  Poor body mechanics, especially when lifting, or poor posture. PREVENTION   Warm up and stretch properly before activity.  Maintain physical fitness:  Strength, flexibility, and endurance.  Cardiovascular fitness.  Learn and use proper technique, especially with posture and lifting. When possible, have coach correct improper technique.  Avoid  activities that place stress on the spine. PROGNOSIS If treated properly, then sciatica usually resolves within 6 weeks. However, occasionally surgery is necessary.  RELATED COMPLICATIONS   Permanent nerve damage, including pain, numbness, tingle, or weakness.  Chronic back pain.  Risks of surgery: infection, bleeding, nerve damage, or damage to surrounding tissues. TREATMENT Treatment initially involves resting from any activities that aggravate your symptoms. The use of ice and medication may help reduce pain and inflammation. The use of strengthening and stretching exercises may help reduce pain with activity. These exercises may be performed at home or with referral to a therapist. A therapist may recommend further treatments, such as transcutaneous electronic nerve stimulation (TENS) or ultrasound. Your caregiver may recommend corticosteroid injections to help reduce inflammation of the sciatic nerve. If symptoms persist despite non-surgical (conservative) treatment, then surgery may be recommended. MEDICATION  If pain medication is necessary, then nonsteroidal anti-inflammatory medications, such as aspirin and ibuprofen, or other minor pain relievers, such as acetaminophen, are often recommended.  Do not take pain medication for 7 days before surgery.  Prescription pain relievers may be given if deemed necessary by your caregiver. Use only as directed and only as much as you need.  Ointments applied to the skin may be helpful.  Corticosteroid injections may be given by your caregiver. These injections should be reserved for the most serious cases, because they may only be given a certain number of times. HEAT AND COLD  Cold treatment (icing) relieves pain and reduces inflammation. Cold treatment should be applied for 10 to 15 minutes every 2 to 3 hours for inflammation and pain and immediately after any activity  that aggravates your symptoms. Use ice packs or massage the area with a  piece of ice (ice massage).  Heat treatment may be used prior to performing the stretching and strengthening activities prescribed by your caregiver, physical therapist, or athletic trainer. Use a heat pack or soak the injury in warm water. SEEK MEDICAL CARE IF:  Treatment seems to offer no benefit, or the condition worsens.  Any medications produce adverse side effects. EXERCISES  RANGE OF MOTION (ROM) AND STRETCHING EXERCISES - Sciatica Most people with sciatic will find that their symptoms worsen with either excessive bending forward (flexion) or arching at the low back (extension). The exercises which will help resolve your symptoms will focus on the opposite motion. Your physician, physical therapist or athletic trainer will help you determine which exercises will be most helpful to resolve your low back pain. Do not complete any exercises without first consulting with your clinician. Discontinue any exercises which worsen your symptoms until you speak to your clinician. If you have pain, numbness or tingling which travels down into your buttocks, leg or foot, the goal of the therapy is for these symptoms to move closer to your back and eventually resolve. Occasionally, these leg symptoms will get better, but your low back pain may worsen; this is typically an indication of progress in your rehabilitation. Be certain to be very alert to any changes in your symptoms and the activities in which you participated in the 24 hours prior to the change. Sharing this information with your clinician will allow him/her to most efficiently treat your condition. These exercises may help you when beginning to rehabilitate your injury. Your symptoms may resolve with or without further involvement from your physician, physical therapist or athletic trainer. While completing these exercises, remember:   Restoring tissue flexibility helps normal motion to return to the joints. This allows healthier, less painful  movement and activity.  An effective stretch should be held for at least 30 seconds.  A stretch should never be painful. You should only feel a gentle lengthening or release in the stretched tissue. FLEXION RANGE OF MOTION AND STRETCHING EXERCISES: STRETCH - Flexion, Single Knee to Chest   Lie on a firm bed or floor with both legs extended in front of you.  Keeping one leg in contact with the floor, bring your opposite knee to your chest. Hold your leg in place by either grabbing behind your thigh or at your knee.  Pull until you feel a gentle stretch in your low back. Hold __________ seconds.  Slowly release your grasp and repeat the exercise with the opposite side. Repeat __________ times. Complete this exercise __________ times per day.  STRETCH - Flexion, Double Knee to Chest  Lie on a firm bed or floor with both legs extended in front of you.  Keeping one leg in contact with the floor, bring your opposite knee to your chest.  Tense your stomach muscles to support your back and then lift your other knee to your chest. Hold your legs in place by either grabbing behind your thighs or at your knees.  Pull both knees toward your chest until you feel a gentle stretch in your low back. Hold __________ seconds.  Tense your stomach muscles and slowly return one leg at a time to the floor. Repeat __________ times. Complete this exercise __________ times per day.  STRETCH - Low Trunk Rotation   Lie on a firm bed or floor. Keeping your legs in front of you, bend your  knees so they are both pointed toward the ceiling and your feet are flat on the floor.  Extend your arms out to the side. This will stabilize your upper body by keeping your shoulders in contact with the floor.  Gently and slowly drop both knees together to one side until you feel a gentle stretch in your low back. Hold for __________ seconds.  Tense your stomach muscles to support your low back as you bring your knees back  to the starting position. Repeat the exercise to the other side. Repeat __________ times. Complete this exercise __________ times per day  EXTENSION RANGE OF MOTION AND FLEXIBILITY EXERCISES: STRETCH - Extension, Prone on Elbows  Lie on your stomach on the floor, a bed will be too soft. Place your palms about shoulder width apart and at the height of your head.  Place your elbows under your shoulders. If this is too painful, stack pillows under your chest.  Allow your body to relax so that your hips drop lower and make contact more completely with the floor.  Hold this position for __________ seconds.  Slowly return to lying flat on the floor. Repeat __________ times. Complete this exercise __________ times per day.  RANGE OF MOTION - Extension, Prone Press Ups  Lie on your stomach on the floor, a bed will be too soft. Place your palms about shoulder width apart and at the height of your head.  Keeping your back as relaxed as possible, slowly straighten your elbows while keeping your hips on the floor. You may adjust the placement of your hands to maximize your comfort. As you gain motion, your hands will come more underneath your shoulders.  Hold this position __________ seconds.  Slowly return to lying flat on the floor. Repeat __________ times. Complete this exercise __________ times per day.  STRENGTHENING EXERCISES - Sciatica  These exercises may help you when beginning to rehabilitate your injury. These exercises should be done near your "sweet spot." This is the neutral, low-back arch, somewhere between fully rounded and fully arched, that is your least painful position. When performed in this safe range of motion, these exercises can be used for people who have either a flexion or extension based injury. These exercises may resolve your symptoms with or without further involvement from your physician, physical therapist or athletic trainer. While completing these exercises,  remember:   Muscles can gain both the endurance and the strength needed for everyday activities through controlled exercises.  Complete these exercises as instructed by your physician, physical therapist or athletic trainer. Progress with the resistance and repetition exercises only as your caregiver advises.  You may experience muscle soreness or fatigue, but the pain or discomfort you are trying to eliminate should never worsen during these exercises. If this pain does worsen, stop and make certain you are following the directions exactly. If the pain is still present after adjustments, discontinue the exercise until you can discuss the trouble with your clinician. STRENGTHENING - Deep Abdominals, Pelvic Tilt   Lie on a firm bed or floor. Keeping your legs in front of you, bend your knees so they are both pointed toward the ceiling and your feet are flat on the floor.  Tense your lower abdominal muscles to press your low back into the floor. This motion will rotate your pelvis so that your tail bone is scooping upwards rather than pointing at your feet or into the floor.  With a gentle tension and even breathing, hold this position  for __________ seconds. Repeat __________ times. Complete this exercise __________ times per day.  STRENGTHENING - Abdominals, Crunches   Lie on a firm bed or floor. Keeping your legs in front of you, bend your knees so they are both pointed toward the ceiling and your feet are flat on the floor. Cross your arms over your chest.  Slightly tip your chin down without bending your neck.  Tense your abdominals and slowly lift your trunk high enough to just clear your shoulder blades. Lifting higher can put excessive stress on the low back and does not further strengthen your abdominal muscles.  Control your return to the starting position. Repeat __________ times. Complete this exercise __________ times per day.  STRENGTHENING - Quadruped, Opposite UE/LE  Lift  Assume a hands and knees position on a firm surface. Keep your hands under your shoulders and your knees under your hips. You may place padding under your knees for comfort.  Find your neutral spine and gently tense your abdominal muscles so that you can maintain this position. Your shoulders and hips should form a rectangle that is parallel with the floor and is not twisted.  Keeping your trunk steady, lift your right hand no higher than your shoulder and then your left leg no higher than your hip. Make sure you are not holding your breath. Hold this position __________ seconds.  Continuing to keep your abdominal muscles tense and your back steady, slowly return to your starting position. Repeat with the opposite arm and leg. Repeat __________ times. Complete this exercise __________ times per day.  STRENGTHENING - Abdominals and Quadriceps, Straight Leg Raise   Lie on a firm bed or floor with both legs extended in front of you.  Keeping one leg in contact with the floor, bend the other knee so that your foot can rest flat on the floor.  Find your neutral spine, and tense your abdominal muscles to maintain your spinal position throughout the exercise.  Slowly lift your straight leg off the floor about 6 inches for a count of 15, making sure to not hold your breath.  Still keeping your neutral spine, slowly lower your leg all the way to the floor. Repeat this exercise with each leg __________ times. Complete this exercise __________ times per day. POSTURE AND BODY MECHANICS CONSIDERATIONS - Sciatica Keeping correct posture when sitting, standing or completing your activities will reduce the stress put on different body tissues, allowing injured tissues a chance to heal and limiting painful experiences. The following are general guidelines for improved posture. Your physician or physical therapist will provide you with any instructions specific to your needs. While reading these  guidelines, remember:  The exercises prescribed by your provider will help you have the flexibility and strength to maintain correct postures.  The correct posture provides the optimal environment for your joints to work. All of your joints have less wear and tear when properly supported by a spine with good posture. This means you will experience a healthier, less painful body.  Correct posture must be practiced with all of your activities, especially prolonged sitting and standing. Correct posture is as important when doing repetitive low-stress activities (typing) as it is when doing a single heavy-load activity (lifting). RESTING POSITIONS Consider which positions are most painful for you when choosing a resting position. If you have pain with flexion-based activities (sitting, bending, stooping, squatting), choose a position that allows you to rest in a less flexed posture. You would want to avoid curling  into a fetal position on your side. If your pain worsens with extension-based activities (prolonged standing, working overhead), avoid resting in an extended position such as sleeping on your stomach. Most people will find more comfort when they rest with their spine in a more neutral position, neither too rounded nor too arched. Lying on a non-sagging bed on your side with a pillow between your knees, or on your back with a pillow under your knees will often provide some relief. Keep in mind, being in any one position for a prolonged period of time, no matter how correct your posture, can still lead to stiffness. PROPER SITTING POSTURE In order to minimize stress and discomfort on your spine, you must sit with correct posture Sitting with good posture should be effortless for a healthy body. Returning to good posture is a gradual process. Many people can work toward this most comfortably by using various supports until they have the flexibility and strength to maintain this posture on their  own. When sitting with proper posture, your ears will fall over your shoulders and your shoulders will fall over your hips. You should use the back of the chair to support your upper back. Your low back will be in a neutral position, just slightly arched. You may place a small pillow or folded towel at the base of your low back for support.  When working at a desk, create an environment that supports good, upright posture. Without extra support, muscles fatigue and lead to excessive strain on joints and other tissues. Keep these recommendations in mind: CHAIR:   A chair should be able to slide under your desk when your back makes contact with the back of the chair. This allows you to work closely.  The chair's height should allow your eyes to be level with the upper part of your monitor and your hands to be slightly lower than your elbows. BODY POSITION  Your feet should make contact with the floor. If this is not possible, use a foot rest.  Keep your ears over your shoulders. This will reduce stress on your neck and low back. INCORRECT SITTING POSTURES   If you are feeling tired and unable to assume a healthy sitting posture, do not slouch or slump. This puts excessive strain on your back tissues, causing more damage and pain. Healthier options include:  Using more support, like a lumbar pillow.  Switching tasks to something that requires you to be upright or walking.  Talking a brief walk.  Lying down to rest in a neutral-spine position. PROLONGED STANDING WHILE SLIGHTLY LEANING FORWARD  When completing a task that requires you to lean forward while standing in one place for a long time, place either foot up on a stationary 2-4 inch high object to help maintain the best posture. When both feet are on the ground, the low back tends to lose its slight inward curve. If this curve flattens (or becomes too large), then the back and your other joints will experience too much stress, fatigue more  quickly and can cause pain.  CORRECT STANDING POSTURES Proper standing posture should be assumed with all daily activities, even if they only take a few moments, like when brushing your teeth. As in sitting, your ears should fall over your shoulders and your shoulders should fall over your hips. You should keep a slight tension in your abdominal muscles to brace your spine. Your tailbone should point down to the ground, not behind your body, resulting in an  over-extended swayback posture.  INCORRECT STANDING POSTURES  Common incorrect standing postures include a forward head, locked knees and/or an excessive swayback. WALKING Walk with an upright posture. Your ears, shoulders and hips should all line-up. PROLONGED ACTIVITY IN A FLEXED POSITION When completing a task that requires you to bend forward at your waist or lean over a low surface, try to find a way to stabilize 3 of 4 of your limbs. You can place a hand or elbow on your thigh or rest a knee on the surface you are reaching across. This will provide you more stability so that your muscles do not fatigue as quickly. By keeping your knees relaxed, or slightly bent, you will also reduce stress across your low back. CORRECT LIFTING TECHNIQUES DO :   Assume a wide stance. This will provide you more stability and the opportunity to get as close as possible to the object which you are lifting.  Tense your abdominals to brace your spine; then bend at the knees and hips. Keeping your back locked in a neutral-spine position, lift using your leg muscles. Lift with your legs, keeping your back straight.  Test the weight of unknown objects before attempting to lift them.  Try to keep your elbows locked down at your sides in order get the best strength from your shoulders when carrying an object.  Always ask for help when lifting heavy or awkward objects. INCORRECT LIFTING TECHNIQUES DO NOT:   Lock your knees when lifting, even if it is a small  object.  Bend and twist. Pivot at your feet or move your feet when needing to change directions.  Assume that you cannot safely pick up a paperclip without proper posture. Document Released: 10/20/2005 Document Revised: 03/06/2014 Document Reviewed: 02/01/2009 [facility] Patient Information 2015 [place], [place]. This information is not intended to replace advice given to you by your health care provider. Make sure you discuss any questions you have with your health care provider.

## 2015-09-07 ENCOUNTER — Other Ambulatory Visit: Payer: Self-pay | Admitting: Family Medicine

## 2015-10-11 ENCOUNTER — Other Ambulatory Visit: Payer: Self-pay | Admitting: Family Medicine

## 2015-10-12 ENCOUNTER — Telehealth: Payer: Self-pay | Admitting: Family Medicine

## 2015-10-12 NOTE — Telephone Encounter (Signed)
Documented

## 2015-10-12 NOTE — Telephone Encounter (Signed)
Patient had Flu Shot in early October at work ([employer])

## 2015-10-17 LAB — HM COLONOSCOPY

## 2015-10-18 ENCOUNTER — Encounter: Payer: Self-pay | Admitting: Family Medicine

## 2015-10-30 ENCOUNTER — Encounter: Payer: Self-pay | Admitting: Family Medicine

## 2015-11-28 MED FILL — ZOLPIDEM TARTRATE 10 MG TAB: 10 | 30 days supply | Qty: 30 | Fill #2

## 2015-11-28 MED FILL — DESOGESTR-ETH ESTRAD ETH ES: 0.15-0.02/0 | 28 days supply | Qty: 28 | Fill #6

## 2015-11-29 MED FILL — ESOMEPRAZOLE MAG DR 40 MG C: 40 | 30 days supply | Qty: 30 | Fill #1

## 2015-12-28 ENCOUNTER — Encounter: Payer: Self-pay | Admitting: Family Medicine

## 2016-01-07 ENCOUNTER — Other Ambulatory Visit: Payer: Self-pay | Admitting: Family Medicine

## 2016-01-07 MED FILL — DESOGESTR-ETH ESTRAD ETH ES: 0.15-0.02/0 | 28 days supply | Qty: 28 | Fill #0

## 2016-01-07 MED FILL — ESOMEPRAZOLE MAG DR 40 MG C: 40 | 30 days supply | Qty: 30 | Fill #2

## 2016-01-07 MED FILL — RIZATRIPTAN 10 MG TABLET: 10 | 30 days supply | Qty: 18 | Fill #1

## 2016-01-31 MED FILL — DESOGESTR-ETH ESTRAD ETH ES: 0.15-0.02/0 | 28 days supply | Qty: 28 | Fill #1

## 2016-02-27 ENCOUNTER — Other Ambulatory Visit: Payer: Self-pay | Admitting: Family Medicine

## 2016-02-27 MED FILL — RIZATRIPTAN 10 MG TABLET: 10 | 18 days supply | Qty: 18 | Fill #0

## 2016-02-27 MED FILL — DESOGESTR-ETH ESTRAD ETH ES: 0.15-0.02/0 | 28 days supply | Qty: 28 | Fill #2

## 2016-02-29 MED FILL — ESOMEPRAZOLE MAG DR 40 MG C: 40 | 30 days supply | Qty: 30 | Fill #3

## 2016-03-11 ENCOUNTER — Ambulatory Visit (INDEPENDENT_AMBULATORY_CARE_PROVIDER_SITE_OTHER): Payer: Federal, State, Local not specified - PPO | Admitting: Physician Assistant

## 2016-03-11 ENCOUNTER — Encounter: Payer: Self-pay | Admitting: Physician Assistant

## 2016-03-11 VITALS — BP 130/88 | HR 86 | Temp 98.3°F | Resp 16 | Ht 62.0 in | Wt 196.0 lb

## 2016-03-11 DIAGNOSIS — F5105 Insomnia due to other mental disorder: Secondary | ICD-10-CM

## 2016-03-11 DIAGNOSIS — F409 Phobic anxiety disorder, unspecified: Secondary | ICD-10-CM

## 2016-03-11 DIAGNOSIS — R42 Dizziness and giddiness: Secondary | ICD-10-CM

## 2016-03-11 DIAGNOSIS — R0789 Other chest pain: Secondary | ICD-10-CM

## 2016-03-11 MED ORDER — ZOLPIDEM TARTRATE 10 MG PO TABS: 10.0000 mg | ORAL_TABLET | Freq: Every evening | ORAL | Status: DC | PRN

## 2016-03-11 MED FILL — ZOLPIDEM TARTRATE 10 MG TAB: 10 | 30 days supply | Qty: 30 | Fill #0

## 2016-03-11 NOTE — Progress Notes (Signed)
Patient presents to clinic today c/o episode of lightheadedness at work 2 days ago. Endorses she was sitting at her desk working on things when she stood up and began lightheaded. Denies dizziness or syncope. States she sat down to relax. Endorses becoming anxious with chest tightness and racing heart. Notes symptoms completely resolved over the next 10 minutes. Denies chest pain or SOB during this episode. Endorses staying well hydrated and eating a well-balanced diet. Does note some left jaw/ear pain when waking up each morning that improves as the day goes on. Also with history of migraines, endorses having 2 headaches this past week associated with photophobia and nausea. States this headaches are the same as her typical migraines. Endorses increased stressors recently, mainly with family that is causing anxiety. Patient previously treated for anxiety/depression by PCP but has stopped medications.  Past Medical History  Diagnosis Date  . Migraines   . Chicken pox as a child    X 2  . Kidney stones 7-12    X 3  . Diverticulosis 2008  . Colon polyps 2008  . GERD (gastroesophageal reflux disease) 2008  . Erosive inflammation of stomach lining 2008  . Obesity 10/08/2011  . Preventative health care 10/13/2011  . Foot pain, right 10/13/2011  . Ovarian cyst, right   . Bronchitis 11/05/2011  . Left shoulder pain 01/04/2012  . Skin tag 06/01/2012  . Cough 06/01/2012  . Diverticulosis 03/25/2013  . Solitary pulmonary nodule 03/25/2013  . Arthritis 06/18/2014  . Depression with anxiety 04/08/2015    Current Outpatient Prescriptions on File Prior to Visit  Medication Sig Dispense Refill  . desogestrel-ethinyl estradiol (KARIVA,AZURETTE,MIRCETTE) 0.15-0.02/0.01 MG (21/5) tablet TAKE 1 TABLET BY MOUTH DAILY 28 tablet 6  . Probiotic Product (PROBIOTIC DAILY PO) Take by mouth daily.    . promethazine (PHENERGAN) 25 MG tablet Take 1 tablet (25 mg total) by mouth every 8 (eight) hours as needed for nausea  or vomiting. 20 tablet 0  . rizatriptan (MAXALT) 10 MG tablet TAKE 1 TABLET BY MOUTH AS NEEDED. MAY REPEAT ONCE IN 2 HOURS IF NEEDED 18 tablet 1   No current facility-administered medications on file prior to visit.    No Known Allergies  Family History  Problem Relation Age of Onset  . Hypertension Mother   . Cancer Mother     skin, melanoma  . Migraines Mother   . Heart disease Maternal Grandmother   . Colon cancer Maternal Grandmother   . Colon cancer Maternal Grandfather   . Colon cancer Maternal Grandfather   . Bipolar disorder Daughter     Social History   Social History  . Marital Status: Married    Spouse Name: Adelle Zachar  . Number of Children: 3  . Years of Education: Assoc degr   Occupational History  . human resources     [employer]    Social History Main Topics  . Smoking status: Never Smoker   . Smokeless tobacco: Never Used  . Alcohol Use: 1.5 oz/week    3 drink(s) per week     Comment: occasionally  . Drug Use: No  . Sexual Activity:    Partners: Male   Other Topics Concern  . Not on file   Social History Narrative   Lives with her partner Lavonna Lampron.           Review of Systems - See HPI.  All other ROS are negative.  BP 126/104 mmHg  Pulse 86  Temp(Src) 98.3 F (36.8  C) (Oral)  Resp 16  Ht '5\' 2"'$  (1.575 m)  Wt 196 lb (88.905 kg)  BMI 35.84 kg/m2  SpO2 98%  Physical Exam  Constitutional: She is oriented to person, place, and time and well-developed, well-nourished, and in no distress.  HENT:  Head: Normocephalic and atraumatic.  Right Ear: External ear normal.  Left Ear: External ear normal.  Nose: Nose normal.  Mouth/Throat: Oropharynx is clear and moist. No oropharyngeal exudate.  Eyes: Conjunctivae are normal. Pupils are equal, round, and reactive to light.  Neck: Neck supple.  Cardiovascular: Normal rate, regular rhythm, normal heart sounds and intact distal pulses.   Pulmonary/Chest: Effort normal and breath sounds normal.  No respiratory distress. She has no wheezes. She has no rales. She exhibits no tenderness.  Lymphadenopathy:    She has no cervical adenopathy.  Neurological: She is alert and oriented to person, place, and time. No cranial nerve deficit.  Skin: Skin is warm and dry. No rash noted.  Psychiatric: Affect normal.  Vitals reviewed.   Recent Results (from the past 2160 hour(s))  CBC     Status: None   Collection Time: 03/11/16  4:35 PM  Result Value Ref Range   WBC 7.3 4.0 - 10.5 K/uL   RBC 4.70 3.87 - 5.11 Mil/uL   Platelets 273.0 150.0 - 400.0 K/uL   Hemoglobin 13.1 12.0 - 15.0 g/dL   HCT 40.2 36.0 - 46.0 %   MCV 85.5 78.0 - 100.0 fl   MCHC 32.7 30.0 - 36.0 g/dL   RDW 14.4 11.5 - 15.5 %  Comp Met (CMET)     Status: None   Collection Time: 03/11/16  4:35 PM  Result Value Ref Range   Sodium 138 135 - 145 mEq/L   Potassium 4.4 3.5 - 5.1 mEq/L   Chloride 106 96 - 112 mEq/L   CO2 25 19 - 32 mEq/L   Glucose, Bld 95 70 - 99 mg/dL   BUN 11 6 - 23 mg/dL   Creatinine, Ser 0.83 0.40 - 1.20 mg/dL   Total Bilirubin 0.4 0.2 - 1.2 mg/dL   Alkaline Phosphatase 65 39 - 117 U/L   AST 16 0 - 37 U/L   ALT 12 0 - 35 U/L   Total Protein 6.8 6.0 - 8.3 g/dL   Albumin 4.1 3.5 - 5.2 g/dL   Calcium 9.0 8.4 - 10.5 mg/dL   GFR 78.74 >60.00 mL/min  TSH     Status: None   Collection Time: 03/11/16  4:35 PM  Result Value Ref Range   TSH 2.36 0.35 - 4.50 uIU/mL    Assessment/Plan: 1. Insomnia due to anxiety and fear Medication refilled. Further refills to come from PCP. - zolpidem (AMBIEN) 10 MG tablet; Take 1 tablet (10 mg total) by mouth at bedtime as needed for sleep.  Dispense: 30 tablet; Refill: 0  2. Other chest pain Chest tightness during likely vasovagal episode. No true pain. Repeat BP within normal limits. EKG with NSR. Do not feel further workup needed at present.   - EKG 12-Lead  3. Lightheadedness 1 episode. OVS negative. EKG with NSR. Suspect vasovagal episode secondary to  stress/anxiety. Recommended she FU with PCP to discuss restarting SSRI. Will check lab panel today. Supportive measures reviewed. Discussed with patient that if there is a recurrence she is to go to the ER for assessment. Will add on a G2 gatorade daily. Encouraged her to keep a fan at desk at work.   - CBC - Comp Met (  CMET) - TSH

## 2016-03-11 NOTE — Patient Instructions (Addendum)
Your EKG, Orthostatic Vital Signs and exam are all reassuring. Your migraines have likely increased in frequency due to the amount of stress you are under. I also believe you are clenching your jaw and grinding the teeth on the left side of your mouth at night.  Please go to the lab for blood work so we can assess things further. Continue Maxalt as directed for migraines.  The lightheadedness I feel is due to a vasovagal reaction where stress, poor hydration, etc are causing the vagus nerve to get overstimulated which temporary drops BP and causes the sensation of lightheadedness.   Please stay well hydrated. Add on a G2 gatorade daily.  Do not skip meals. Keep at fan at the desk at work to keep you cool.  Follow-up with me or Dr. Abner GreenspanBlyth on Friday. If symptoms are still recurring we will need to consider CT scan.  If any severe symptoms were to occur, please go to the ER.  I do think we should restart medications for stress and anxiety giving your current stress level.

## 2016-03-11 NOTE — Progress Notes (Signed)
Pre visit review using our clinic review tool, if applicable. No additional management support is needed unless otherwise documented below in the visit note/SLS  

## 2016-03-12 LAB — CBC
HEMATOCRIT: 40.2 % (ref 36.0–46.0)
Hemoglobin: 13.1 g/dL (ref 12.0–15.0)
MCHC: 32.7 g/dL (ref 30.0–36.0)
MCV: 85.5 fl (ref 78.0–100.0)
PLATELETS: 273 10*3/uL (ref 150.0–400.0)
RBC: 4.7 Mil/uL (ref 3.87–5.11)
RDW: 14.4 % (ref 11.5–15.5)
WBC: 7.3 10*3/uL (ref 4.0–10.5)

## 2016-03-12 LAB — COMPREHENSIVE METABOLIC PANEL
ALBUMIN: 4.1 g/dL (ref 3.5–5.2)
ALT: 12 U/L (ref 0–35)
AST: 16 U/L (ref 0–37)
Alkaline Phosphatase: 65 U/L (ref 39–117)
BUN: 11 mg/dL (ref 6–23)
CHLORIDE: 106 meq/L (ref 96–112)
CO2: 25 meq/L (ref 19–32)
Calcium: 9 mg/dL (ref 8.4–10.5)
Creatinine, Ser: 0.83 mg/dL (ref 0.40–1.20)
GFR: 78.74 mL/min (ref >60.00)
Glucose, Bld: 95 mg/dL (ref 70–99)
POTASSIUM: 4.4 meq/L (ref 3.5–5.1)
Sodium: 138 mEq/L (ref 135–145)
Total Bilirubin: 0.4 mg/dL (ref 0.2–1.2)
Total Protein: 6.8 g/dL (ref 6.0–8.3)

## 2016-03-12 LAB — TSH: TSH: 2.36 u[IU]/mL (ref 0.35–4.50)

## 2016-03-14 ENCOUNTER — Encounter: Payer: Self-pay | Admitting: Physician Assistant

## 2016-03-14 ENCOUNTER — Ambulatory Visit: Payer: Self-pay | Admitting: Physician Assistant

## 2016-03-14 DIAGNOSIS — Z0289 Encounter for other administrative examinations: Secondary | ICD-10-CM

## 2016-03-14 NOTE — Telephone Encounter (Signed)
Please Advise

## 2016-03-16 ENCOUNTER — Encounter: Payer: Self-pay | Admitting: Physician Assistant

## 2016-03-17 DIAGNOSIS — K080 Exfoliation of teeth due to systemic causes: Secondary | ICD-10-CM | POA: Diagnosis not present

## 2016-03-26 ENCOUNTER — Ambulatory Visit: Payer: Self-pay | Admitting: Physician Assistant

## 2016-03-26 MED FILL — DESOGESTR-ETH ESTRAD ETH ES: 0.15-0.02/0 | 28 days supply | Qty: 28 | Fill #3

## 2016-03-28 ENCOUNTER — Encounter: Payer: Self-pay | Admitting: Physician Assistant

## 2016-03-28 ENCOUNTER — Ambulatory Visit (INDEPENDENT_AMBULATORY_CARE_PROVIDER_SITE_OTHER): Payer: Federal, State, Local not specified - PPO | Admitting: Physician Assistant

## 2016-03-28 VITALS — BP 124/78 | HR 93 | Temp 98.6°F | Ht 62.0 in | Wt 196.4 lb

## 2016-03-28 DIAGNOSIS — R0789 Other chest pain: Secondary | ICD-10-CM | POA: Diagnosis not present

## 2016-03-28 DIAGNOSIS — M19031 Primary osteoarthritis, right wrist: Secondary | ICD-10-CM | POA: Diagnosis not present

## 2016-03-28 DIAGNOSIS — M19032 Primary osteoarthritis, left wrist: Secondary | ICD-10-CM

## 2016-03-28 DIAGNOSIS — R42 Dizziness and giddiness: Secondary | ICD-10-CM

## 2016-03-28 MED ORDER — DICLOFENAC SODIUM 1 % TD GEL: 2.0000 g | Freq: Four times a day (QID) | TRANSDERMAL | Status: DC

## 2016-03-28 MED ORDER — VENLAFAXINE HCL ER 37.5 MG PO CP24: 37.5000 mg | ORAL_CAPSULE | Freq: Every day | ORAL | Status: DC

## 2016-03-28 MED FILL — DICLOFENAC SODIUM 1% GEL: 1 | 13 days supply | Qty: 100 | Fill #0

## 2016-03-28 MED FILL — VENLAFAXINE HCL ER 37.5 MG: 37.5 | 30 days supply | Qty: 30 | Fill #0

## 2016-03-28 NOTE — Patient Instructions (Signed)
Please start the Effexor daily as directed. Increase fluids. Try to stay calm at work.  You will be contacted for an echocardiogram. Please attend this appointment.   If anything recurs and is significant or if you have shortness of breath, please call [phone].

## 2016-03-28 NOTE — Progress Notes (Signed)
Pre visit review using our clinic review tool, if applicable. No additional management support is needed unless otherwise documented below in the visit note. 

## 2016-04-06 NOTE — Progress Notes (Signed)
Patient presents to clinic today to follow-up of lightheadedness and anxiety. Endorses she has had a couple of episodes of lightheadedness at work in the past couple of weeks, lasting for a few minutes each. Denies chest pain, palpitations, dizziness or SOB. Episodes are occurring while she is sitting. Denies episodes anywhere else. At last visit EKG obtained and within normal limits. Labs unremarkable. Patient has been eating more but is not staying hydrated at work. Denies new or worsening symptoms. Is still having significant anxiety due to family stressors.  Past Medical History  Diagnosis Date  . Migraines   . Chicken pox as a child    X 2  . Kidney stones 7-12    X 3  . Diverticulosis 2008  . Colon polyps 2008  . GERD (gastroesophageal reflux disease) 2008  . Erosive inflammation of stomach lining 2008  . Obesity 10/08/2011  . Preventative health care 10/13/2011  . Foot pain, right 10/13/2011  . Ovarian cyst, right   . Bronchitis 11/05/2011  . Left shoulder pain 01/04/2012  . Skin tag 06/01/2012  . Cough 06/01/2012  . Diverticulosis 03/25/2013  . Solitary pulmonary nodule 03/25/2013  . Arthritis 06/18/2014  . Depression with anxiety 04/08/2015    Current Outpatient Prescriptions on File Prior to Visit  Medication Sig Dispense Refill  . desogestrel-ethinyl estradiol (KARIVA,AZURETTE,MIRCETTE) 0.15-0.02/0.01 MG (21/5) tablet TAKE 1 TABLET BY MOUTH DAILY 28 tablet 6  . omeprazole (PRILOSEC) 40 MG capsule Take 40 mg by mouth daily.    . Probiotic Product (PROBIOTIC DAILY PO) Take by mouth daily.    . promethazine (PHENERGAN) 25 MG tablet Take 1 tablet (25 mg total) by mouth every 8 (eight) hours as needed for nausea or vomiting. 20 tablet 0  . rizatriptan (MAXALT) 10 MG tablet TAKE 1 TABLET BY MOUTH AS NEEDED. MAY REPEAT ONCE IN 2 HOURS IF NEEDED 18 tablet 1  . zolpidem (AMBIEN) 10 MG tablet Take 1 tablet (10 mg total) by mouth at bedtime as needed for sleep. 30 tablet 0   No current  facility-administered medications on file prior to visit.    No Known Allergies  Family History  Problem Relation Age of Onset  . Hypertension Mother   . Cancer Mother     skin, melanoma  . Migraines Mother   . Heart disease Maternal Grandmother   . Colon cancer Maternal Grandmother   . Colon cancer Maternal Grandfather   . Colon cancer Maternal Grandfather   . Bipolar disorder Daughter     Social History   Social History  . Marital Status: Married    Spouse Name: Juan Olthoff  . Number of Children: 3  . Years of Education: Assoc degr   Occupational History  . human resources     [employer]    Social History Main Topics  . Smoking status: Never Smoker   . Smokeless tobacco: Never Used  . Alcohol Use: 1.5 oz/week    3 drink(s) per week     Comment: occasionally  . Drug Use: No  . Sexual Activity:    Partners: Male   Other Topics Concern  . None   Social History Narrative   Lives with her partner Rhylan Gross.           Review of Systems - See HPI.  All other ROS are negative.  BP 124/78 mmHg  Pulse 93  Temp(Src) 98.6 F (37 C) (Oral)  Ht '5\' 2"'$  (1.575 m)  Wt 196 lb 6 oz (89.075  kg)  BMI 35.91 kg/m2  SpO2 98%  LMP 03/27/2016 (Exact Date)  Physical Exam  Constitutional: She is oriented to person, place, and time and well-developed, well-nourished, and in no distress.  HENT:  Head: Normocephalic and atraumatic.  Eyes: Conjunctivae are normal.  Neck: Carotid bruit is not present.  Cardiovascular: Normal rate, regular rhythm, normal heart sounds and intact distal pulses.   Pulses:      Carotid pulses are 2+ on the right side, and 2+ on the left side. Pulmonary/Chest: Effort normal and breath sounds normal. No respiratory distress. She has no wheezes. She has no rales. She exhibits no tenderness.  Neurological: She is alert and oriented to person, place, and time. She has normal motor skills. No cranial nerve deficit.  Skin: Skin is warm and dry. No rash  noted.  Psychiatric: Affect normal.  Vitals reviewed.   Recent Results (from the past 2160 hour(s))  CBC     Status: None   Collection Time: 03/11/16  4:35 PM  Result Value Ref Range   WBC 7.3 4.0 - 10.5 K/uL   RBC 4.70 3.87 - 5.11 Mil/uL   Platelets 273.0 150.0 - 400.0 K/uL   Hemoglobin 13.1 12.0 - 15.0 g/dL   HCT 40.2 36.0 - 46.0 %   MCV 85.5 78.0 - 100.0 fl   MCHC 32.7 30.0 - 36.0 g/dL   RDW 14.4 11.5 - 15.5 %  Comp Met (CMET)     Status: None   Collection Time: 03/11/16  4:35 PM  Result Value Ref Range   Sodium 138 135 - 145 mEq/L   Potassium 4.4 3.5 - 5.1 mEq/L   Chloride 106 96 - 112 mEq/L   CO2 25 19 - 32 mEq/L   Glucose, Bld 95 70 - 99 mg/dL   BUN 11 6 - 23 mg/dL   Creatinine, Ser 0.83 0.40 - 1.20 mg/dL   Total Bilirubin 0.4 0.2 - 1.2 mg/dL   Alkaline Phosphatase 65 39 - 117 U/L   AST 16 0 - 37 U/L   ALT 12 0 - 35 U/L   Total Protein 6.8 6.0 - 8.3 g/dL   Albumin 4.1 3.5 - 5.2 g/dL   Calcium 9.0 8.4 - 10.5 mg/dL   GFR 78.74 >60.00 mL/min  TSH     Status: None   Collection Time: 03/11/16  4:35 PM  Result Value Ref Range   TSH 2.36 0.35 - 4.50 uIU/mL    Assessment/Plan: 1. Episodic lightheadedness Workup thus far including labs and EKG are unremarkable. Do feel anxiety is a major component. We have started Effexor. Continue other medications. She is to work on hydration. Will plan on echocardiogram. Order placed.Alarm symptoms/signs reviewed that would prompt ER assessment. - Echocardiogram; Future   2. Osteoarthritis of both wrists, unspecified osteoarthritis type Voltaren gel refilled. - diclofenac sodium (VOLTAREN) 1 % GEL; Apply 2 g topically 4 (four) times daily.  Dispense: 100 g; Refill: 1

## 2016-04-07 MED FILL — BENZONATATE 100 MG CAPSULE: 100 | 7 days supply | Qty: 40 | Fill #1

## 2016-04-17 DIAGNOSIS — K080 Exfoliation of teeth due to systemic causes: Secondary | ICD-10-CM | POA: Diagnosis not present

## 2016-04-21 ENCOUNTER — Other Ambulatory Visit: Payer: Self-pay

## 2016-04-21 ENCOUNTER — Ambulatory Visit (HOSPITAL_COMMUNITY): Payer: Federal, State, Local not specified - PPO | Attending: Internal Medicine

## 2016-04-21 DIAGNOSIS — I517 Cardiomegaly: Secondary | ICD-10-CM | POA: Insufficient documentation

## 2016-04-21 DIAGNOSIS — R0789 Other chest pain: Secondary | ICD-10-CM | POA: Insufficient documentation

## 2016-04-21 DIAGNOSIS — Z6835 Body mass index (BMI) 35.0-35.9, adult: Secondary | ICD-10-CM | POA: Diagnosis not present

## 2016-04-21 DIAGNOSIS — E669 Obesity, unspecified: Secondary | ICD-10-CM | POA: Diagnosis not present

## 2016-04-21 DIAGNOSIS — R42 Dizziness and giddiness: Secondary | ICD-10-CM | POA: Insufficient documentation

## 2016-04-21 DIAGNOSIS — R079 Chest pain, unspecified: Secondary | ICD-10-CM | POA: Diagnosis present

## 2016-04-21 LAB — ECHOCARDIOGRAM COMPLETE
E decel time: 324 msec
EERAT: 5.38
FS: 34 % (ref 28–44)
IV/PV OW: 1.2
LA diam end sys: 36 mm
LA vol A4C: 26 ml
LA vol index: 17.9 mL/m2
LADIAMINDEX: 1.79 cm/m2
LASIZE: 36 mm
LAVOL: 36 mL
LV E/e' medial: 5.38
LV E/e'average: 5.38
LV e' LATERAL: 11.3 cm/s
LVOT SV: 73 mL
LVOT VTI: 23.2 cm
LVOT area: 3.14 cm2
LVOT diameter: 20 mm
LVOT peak grad rest: 5 mmHg
LVOTPV: 108 cm/s
MV Dec: 324
MV pk A vel: 64.2 m/s
MVPKEVEL: 60.8 m/s
PW: 11.5 mm — AB (ref 0.6–1.1)
TDI e' lateral: 11.3
TDI e' medial: 8.55

## 2016-04-24 ENCOUNTER — Encounter: Payer: Self-pay | Admitting: Family Medicine

## 2016-04-25 ENCOUNTER — Encounter: Payer: Self-pay | Admitting: *Deleted

## 2016-04-25 MED FILL — DESOGESTR-ETH ESTRAD ETH ES: 0.15-0.02/0 | 28 days supply | Qty: 28 | Fill #4

## 2016-04-25 MED FILL — ESOMEPRAZOLE MAG DR 40 MG C: 40 | 30 days supply | Qty: 30 | Fill #4

## 2016-04-25 MED FILL — RIZATRIPTAN 10 MG TABLET: 10 | 18 days supply | Qty: 18 | Fill #1

## 2016-04-25 MED FILL — VENLAFAXINE HCL ER 37.5 MG: 37.5 | 30 days supply | Qty: 30 | Fill #1

## 2016-05-08 ENCOUNTER — Encounter: Payer: Self-pay | Admitting: Family Medicine

## 2016-05-08 ENCOUNTER — Ambulatory Visit (INDEPENDENT_AMBULATORY_CARE_PROVIDER_SITE_OTHER): Payer: Federal, State, Local not specified - PPO | Admitting: Family Medicine

## 2016-05-08 VITALS — BP 132/88 | HR 91 | Temp 98.4°F | Ht 62.0 in | Wt 194.5 lb

## 2016-05-08 DIAGNOSIS — I517 Cardiomegaly: Secondary | ICD-10-CM | POA: Diagnosis not present

## 2016-05-08 DIAGNOSIS — E669 Obesity, unspecified: Secondary | ICD-10-CM | POA: Diagnosis not present

## 2016-05-08 DIAGNOSIS — M533 Sacrococcygeal disorders, not elsewhere classified: Secondary | ICD-10-CM | POA: Diagnosis not present

## 2016-05-08 DIAGNOSIS — R030 Elevated blood-pressure reading, without diagnosis of hypertension: Secondary | ICD-10-CM

## 2016-05-08 DIAGNOSIS — IMO0001 Reserved for inherently not codable concepts without codable children: Secondary | ICD-10-CM

## 2016-05-08 DIAGNOSIS — F418 Other specified anxiety disorders: Secondary | ICD-10-CM

## 2016-05-08 DIAGNOSIS — E782 Mixed hyperlipidemia: Secondary | ICD-10-CM

## 2016-05-08 MED ORDER — TIZANIDINE HCL 4 MG PO TABS: 4.0000 mg | ORAL_TABLET | Freq: Four times a day (QID) | ORAL | Status: DC | PRN

## 2016-05-08 MED ORDER — MELOXICAM 7.5 MG PO TABS: 7.5000 mg | ORAL_TABLET | Freq: Every day | ORAL | Status: DC | PRN

## 2016-05-08 MED FILL — tiZANidine HCL 4 MG TABS: 4 | 8 days supply | Qty: 30 | Fill #0

## 2016-05-08 MED FILL — MELOXICAM 7.5 MG TABLET: 7.5 | 30 days supply | Qty: 30 | Fill #0

## 2016-05-08 NOTE — Patient Instructions (Signed)
Sacroiliac Joint Dysfunction Sacroiliac joint dysfunction is a condition that causes inflammation on one or both sides of the sacroiliac (SI) joint. The SI joint connects the lower part of the spine (sacrum) with the two upper portions of the pelvis (ilium). This condition causes deep aching or burning pain in the low back. In some cases, the pain may also spread into one or both buttocks or hips or spread down the legs. CAUSES This condition may be caused by:  Pregnancy. During pregnancy, extra stress is put on the SI joints because the pelvis widens.  Injury, such as:  Car accidents.  Sport-related injuries.  Work-related injuries.  Having one leg that is shorter than the other.  Conditions that affect the joints, such as:  Rheumatoid arthritis.  Gout.  Psoriatic arthritis.  Joint infection (septic arthritis). Sometimes, the cause of SI joint dysfunction is not known. SYMPTOMS Symptoms of this condition include:  Aching or burning pain in the lower back. The pain may also spread to other areas, such as:  Buttocks.  Groin.  Thighs and legs.  Muscle spasms in or around the painful areas.  Increased pain when standing, walking, running, stair climbing, bending, or lifting. DIAGNOSIS Your health care provider will do a physical exam and take your medical history. During the exam, the health care provider may move one or both of your legs to different positions to check for pain. Various tests may be done to help verify the diagnosis, including:  Imaging tests to look for other causes of pain. These may include:  MRI.  CT scan.  Bone scan.  Diagnostic injection. A numbing medicine is injected into the SI joint using a needle. If the pain is temporarily improved or stopped after the injection, this can indicate that SI joint dysfunction is the problem. TREATMENT Treatment may vary depending on the cause and severity of your condition. Treatment options may  include:  Applying ice or heat to the lower back area. This can help to reduce pain and muscle spasms.  Medicines to relieve pain or inflammation or to relax the muscles.  Wearing a back brace (sacroiliac brace) to help support the joint while your back is healing.  Physical therapy to increase muscle strength around the joint and flexibility at the joint. This may also involve learning proper body positions and ways of moving to relieve stress on the joint.  Direct manipulation of the SI joint.  Injections of steroid medicine into the joint in order to reduce pain and swelling.  Radiofrequency ablation to burn away nerves that are carrying pain messages from the joint.  Use of a device that provides electrical stimulation in order to reduce pain at the joint.  Surgery to put in screws and plates that limit or prevent joint motion. This is rare. HOME CARE INSTRUCTIONS  Rest as needed. Limit your activities as directed by your health care provider.  Take medicines only as directed by your health care provider.  If directed, apply ice to the affected area:  Put ice in a plastic bag.  Place a towel between your skin and the bag.  Leave the ice on for 20 minutes, 2-3 times per day.  Use a heating pad or a moist heat pack as directed by your health care provider.  Exercise as directed by your health care provider or physical therapist.  Keep all follow-up visits as directed by your health care provider. This is important. SEEK MEDICAL CARE IF:  Your pain is not controlled   with medicine.  You have a fever.  You have increasingly severe pain. SEEK IMMEDIATE MEDICAL CARE IF:  You have weakness, numbness, or tingling in your legs or feet.  You lose control of your bladder or bowel.   This information is not intended to replace advice given to you by your health care provider. Make sure you discuss any questions you have with your health care provider.   Document Released:  01/16/2009 Document Revised: 03/06/2015 Document Reviewed: 06/27/2014 Elsevier Interactive Patient Education 2016 [employer]  

## 2016-05-08 NOTE — Progress Notes (Signed)
Pre visit review using our clinic review tool, if applicable. No additional management support is needed unless otherwise documented below in the visit note. 

## 2016-05-09 ENCOUNTER — Other Ambulatory Visit: Payer: Self-pay | Admitting: Family Medicine

## 2016-05-09 ENCOUNTER — Telehealth: Payer: Self-pay | Admitting: Family Medicine

## 2016-05-09 DIAGNOSIS — M545 Low back pain: Secondary | ICD-10-CM

## 2016-05-09 NOTE — Telephone Encounter (Signed)
Xray is ordered so she can proceed, after checking her vitals decided to hold off on beta blocker for now.

## 2016-05-09 NOTE — Telephone Encounter (Signed)
Pt called in because she says in her appt with the provider she believe that the provider mentioned her starting a Beta Blocker and also having x-rays. Pt says that she didn't have a Rx for a beta blocker and hasnt heard anything about a x-ray. Please advise pt further.

## 2016-05-12 NOTE — Telephone Encounter (Signed)
Patient informed. 

## 2016-05-18 ENCOUNTER — Encounter: Payer: Self-pay | Admitting: Family Medicine

## 2016-05-18 DIAGNOSIS — I517 Cardiomegaly: Secondary | ICD-10-CM

## 2016-05-18 DIAGNOSIS — R030 Elevated blood-pressure reading, without diagnosis of hypertension: Secondary | ICD-10-CM

## 2016-05-18 DIAGNOSIS — IMO0001 Reserved for inherently not codable concepts without codable children: Secondary | ICD-10-CM

## 2016-05-18 DIAGNOSIS — M533 Sacrococcygeal disorders, not elsewhere classified: Secondary | ICD-10-CM | POA: Insufficient documentation

## 2016-05-18 HISTORY — DX: Cardiomegaly: I51.7

## 2016-05-18 HISTORY — DX: Reserved for inherently not codable concepts without codable children: IMO0001

## 2016-05-18 NOTE — Assessment & Plan Note (Signed)
Encouraged heart healthy diet, increase exercise, avoid trans fats, consider a krill oil cap daily 

## 2016-05-18 NOTE — Assessment & Plan Note (Signed)
Asymptomatic.  Echo June 2017 Mild LVH, mild MVR, stage 1 diastolic dysfunction Reviewed with patien

## 2016-05-18 NOTE — Assessment & Plan Note (Signed)
Well controlled, no changes to meds. Encouraged heart healthy diet such as the DASH diet and exercise as tolerated.  °

## 2016-05-18 NOTE — Progress Notes (Signed)
Patient ID: Lisa Ray, female   DOB: June 14, 1971, 45 y.o.   MRN: [id number]   Subjective:    Patient ID: Lisa Ray, female    DOB: 07-Feb-1971, 45 y.o.   MRN: [id number]  Chief Complaint  Patient presents with  . Results    HPI Patient is in today for follow up and struggling with low back pain, feels best when standing and lying flat. Change in position is painful. No incontinence, urinary or bowels. Denies CP/palp/SOB/HA/congestion/fevers/GI or GU c/o. Taking meds as prescribed  Past Medical History  Diagnosis Date  . Migraines   . Chicken pox as a child    X 2  . Kidney stones 7-12    X 3  . Diverticulosis 2008  . Colon polyps 2008  . GERD (gastroesophageal reflux disease) 2008  . Erosive inflammation of stomach lining 2008  . Obesity 10/08/2011  . Preventative health care 10/13/2011  . Foot pain, right 10/13/2011  . Ovarian cyst, right   . Bronchitis 11/05/2011  . Left shoulder pain 01/04/2012  . Skin tag 06/01/2012  . Cough 06/01/2012  . Diverticulosis 03/25/2013  . Solitary pulmonary nodule 03/25/2013  . Arthritis 06/18/2014  . Depression with anxiety 04/08/2015  . LVH (left ventricular hypertrophy) 05/18/2016    Echo June 2017 Mild LVH, mild MVR, stage 1 diastolic dysfunction  . Elevated BP 05/18/2016    Past Surgical History  Procedure Laterality Date  . Lithotripsy  2008  . Tonsilectomy, adenoidectomy, bilateral myringotomy and tubes      Age 45  . Stent for kidney stone  7-12  . Ovarian cyst removal      Family History  Problem Relation Age of Onset  . Hypertension Mother   . Cancer Mother     skin, melanoma  . Migraines Mother   . Heart disease Maternal Grandmother   . Colon cancer Maternal Grandmother   . Colon cancer Maternal Grandfather   . Colon cancer Maternal Grandfather   . Bipolar disorder Daughter     Social History   Social History  . Marital Status: Married    Spouse Name: Markelle Asaro  . Number of Children: 3  . Years of  Education: Assoc degr   Occupational History  . human resources     [employer]    Social History Main Topics  . Smoking status: Never Smoker   . Smokeless tobacco: Never Used  . Alcohol Use: 1.5 oz/week    3 drink(s) per week     Comment: occasionally  . Drug Use: No  . Sexual Activity:    Partners: Male   Other Topics Concern  . Not on file   Social History Narrative   Lives with her partner Joleigh Mineau.           Outpatient Prescriptions Prior to Visit  Medication Sig Dispense Refill  . desogestrel-ethinyl estradiol (KARIVA,AZURETTE,MIRCETTE) 0.15-0.02/0.01 MG (21/5) tablet TAKE 1 TABLET BY MOUTH DAILY 28 tablet 6  . diclofenac sodium (VOLTAREN) 1 % GEL Apply 2 g topically 4 (four) times daily. 100 g 1  . omeprazole (PRILOSEC) 40 MG capsule Take 40 mg by mouth daily.    . Probiotic Product (PROBIOTIC DAILY PO) Take by mouth daily.    . promethazine (PHENERGAN) 25 MG tablet Take 1 tablet (25 mg total) by mouth every 8 (eight) hours as needed for nausea or vomiting. 20 tablet 0  . rizatriptan (MAXALT) 10 MG tablet TAKE 1 TABLET BY MOUTH AS NEEDED. MAY REPEAT ONCE  IN 2 HOURS IF NEEDED 18 tablet 1  . venlafaxine XR (EFFEXOR XR) 37.5 MG 24 hr capsule Take 1 capsule (37.5 mg total) by mouth daily with breakfast. 30 capsule 3  . zolpidem (AMBIEN) 10 MG tablet Take 1 tablet (10 mg total) by mouth at bedtime as needed for sleep. 30 tablet 0   No facility-administered medications prior to visit.    No Known Allergies  Review of Systems  Constitutional: Negative for fever and malaise/fatigue.  HENT: Negative for congestion.   Eyes: Negative for blurred vision.  Respiratory: Negative for shortness of breath.   Cardiovascular: Negative for chest pain, palpitations and leg swelling.  Gastrointestinal: Negative for nausea, abdominal pain and blood in stool.  Genitourinary: Negative for dysuria and frequency.  Musculoskeletal: Positive for back pain. Negative for falls.  Skin: Negative  for rash.  Neurological: Negative for dizziness, loss of consciousness and headaches.  Endo/Heme/Allergies: Negative for environmental allergies.  Psychiatric/Behavioral: Positive for depression. The patient is nervous/anxious.        Objective:    Physical Exam  Constitutional: She is oriented to person, place, and time. She appears well-developed and well-nourished. No distress.  HENT:  Head: Normocephalic and atraumatic.  Nose: Nose normal.  Eyes: Right eye exhibits no discharge. Left eye exhibits no discharge.  Neck: Normal range of motion. Neck supple.  Cardiovascular: Normal rate and regular rhythm.   No murmur heard. Pulmonary/Chest: Effort normal and breath sounds normal.  Abdominal: Soft. Bowel sounds are normal. There is no tenderness.  Musculoskeletal: She exhibits no edema.  Neurological: She is alert and oriented to person, place, and time.  Skin: Skin is warm and dry.  Psychiatric: She has a normal mood and affect.  Nursing note and vitals reviewed.   BP 132/88 mmHg  Pulse 91  Temp(Src) 98.4 F (36.9 C) (Oral)  Ht 5\' 2"  (1.575 m)  Wt 194 lb 8 oz (88.225 kg)  BMI 35.57 kg/m2  SpO2 97% Wt Readings from Last 3 Encounters:  05/08/16 194 lb 8 oz (88.225 kg)  03/28/16 196 lb 6 oz (89.075 kg)  03/11/16 196 lb (88.905 kg)     Lab Results  Component Value Date   WBC 7.3 03/11/2016   HGB 13.1 03/11/2016   HCT 40.2 03/11/2016   PLT 273.0 03/11/2016   GLUCOSE 95 03/11/2016   CHOL 196 04/03/2015   TRIG 160.0* 04/03/2015   HDL 53.80 04/03/2015   LDLCALC 110* 04/03/2015   ALT 12 03/11/2016   AST 16 03/11/2016   NA 138 03/11/2016   K 4.4 03/11/2016   CL 106 03/11/2016   CREATININE 0.83 03/11/2016   BUN 11 03/11/2016   CO2 25 03/11/2016   TSH 2.36 03/11/2016    Lab Results  Component Value Date   TSH 2.36 03/11/2016   Lab Results  Component Value Date   WBC 7.3 03/11/2016   HGB 13.1 03/11/2016   HCT 40.2 03/11/2016   MCV 85.5 03/11/2016   PLT  273.0 03/11/2016   Lab Results  Component Value Date   NA 138 03/11/2016   K 4.4 03/11/2016   CO2 25 03/11/2016   GLUCOSE 95 03/11/2016   BUN 11 03/11/2016   CREATININE 0.83 03/11/2016   BILITOT 0.4 03/11/2016   ALKPHOS 65 03/11/2016   AST 16 03/11/2016   ALT 12 03/11/2016   PROT 6.8 03/11/2016   ALBUMIN 4.1 03/11/2016   CALCIUM 9.0 03/11/2016   GFR 78.74 03/11/2016   Lab Results  Component Value Date  CHOL 196 04/03/2015   Lab Results  Component Value Date   HDL 53.80 04/03/2015   Lab Results  Component Value Date   LDLCALC 110* 04/03/2015   Lab Results  Component Value Date   TRIG 160.0* 04/03/2015   Lab Results  Component Value Date   CHOLHDL 4 04/03/2015   No results found for: HGBA1C     Assessment & Plan:   Problem List Items Addressed This Visit    Obesity    Encouraged DASH diet, decrease po intake and increase exercise as tolerated. Needs 7-8 hours of sleep nightly. Avoid trans fats, eat small, frequent meals every 4-5 hours with lean proteins, complex carbs and healthy fats. Minimize simple carbs. Encouraged weight loss to help manage healthy heart.       Hyperlipidemia, mixed    Encouraged heart healthy diet, increase exercise, avoid trans fats, consider a krill oil cap daily      Depression with anxiety    Venlafaxine XR 37.5 mg daily      LVH (left ventricular hypertrophy)    Asymptomatic.  Echo June 2017 Mild LVH, mild MVR, stage 1 diastolic dysfunction Reviewed with patien      Sacroiliac dysfunction - Primary    Encouraged moist heat and gentle stretching as tolerated. May try NSAIDs and prescription meds as directed and report if symptoms worsen or seek immediate care. Encouraged topical patches, Meloxicam prn, Tizanidine prn      Relevant Medications   tiZANidine (ZANAFLEX) 4 MG tablet   Elevated BP    Well controlled, no changes to meds. Encouraged heart healthy diet such as the DASH diet and exercise as tolerated          I am having Ms. Iafrate start on tiZANidine and meloxicam. I am also having her maintain her Probiotic Product (PROBIOTIC DAILY PO), promethazine, desogestrel-ethinyl estradiol, rizatriptan, omeprazole, zolpidem, venlafaxine XR, and diclofenac sodium.  Meds ordered this encounter  Medications  . tiZANidine (ZANAFLEX) 4 MG tablet    Sig: Take 1 tablet (4 mg total) by mouth every 6 (six) hours as needed for muscle spasms.    Dispense:  30 tablet    Refill:  0  . meloxicam (MOBIC) 7.5 MG tablet    Sig: Take 1 tablet (7.5 mg total) by mouth daily as needed for pain.    Dispense:  30 tablet    Refill:  5     Danise Edge, MD

## 2016-05-18 NOTE — Assessment & Plan Note (Signed)
Encouraged moist heat and gentle stretching as tolerated. May try NSAIDs and prescription meds as directed and report if symptoms worsen or seek immediate care. Encouraged topical patches, Meloxicam prn, Tizanidine prn

## 2016-05-18 NOTE — Assessment & Plan Note (Signed)
Venlafaxine XR 37.5 mg daily

## 2016-05-18 NOTE — Assessment & Plan Note (Signed)
Encouraged DASH diet, decrease po intake and increase exercise as tolerated. Needs 7-8 hours of sleep nightly. Avoid trans fats, eat small, frequent meals every 4-5 hours with lean proteins, complex carbs and healthy fats. Minimize simple carbs. Encouraged weight loss to help manage healthy heart.

## 2016-05-21 ENCOUNTER — Other Ambulatory Visit: Payer: Self-pay | Admitting: Family Medicine

## 2016-05-21 MED FILL — DESOGESTR-ETH ESTRAD ETH ES: 0.15-0.02/0 | 28 days supply | Qty: 28 | Fill #5

## 2016-05-22 MED FILL — RIZATRIPTAN 10 MG TABLET: 10 | 30 days supply | Qty: 18 | Fill #0

## 2016-06-06 ENCOUNTER — Ambulatory Visit (HOSPITAL_BASED_OUTPATIENT_CLINIC_OR_DEPARTMENT_OTHER)
Admission: RE | Admit: 2016-06-06 | Discharge: 2016-06-06 | Disposition: A | Discharge status: Home / Self Care | Payer: Federal, State, Local not specified - PPO | Source: Ambulatory Visit | Attending: Family Medicine | Admitting: Family Medicine

## 2016-06-06 ENCOUNTER — Other Ambulatory Visit: Payer: Self-pay | Admitting: Family Medicine

## 2016-06-06 DIAGNOSIS — M5136 Other intervertebral disc degeneration, lumbar region: Secondary | ICD-10-CM | POA: Diagnosis not present

## 2016-06-06 DIAGNOSIS — M545 Low back pain: Secondary | ICD-10-CM | POA: Diagnosis not present

## 2016-06-06 DIAGNOSIS — M4806 Spinal stenosis, lumbar region: Secondary | ICD-10-CM | POA: Insufficient documentation

## 2016-06-06 DIAGNOSIS — Z1231 Encounter for screening mammogram for malignant neoplasm of breast: Secondary | ICD-10-CM

## 2016-06-12 ENCOUNTER — Ambulatory Visit (HOSPITAL_BASED_OUTPATIENT_CLINIC_OR_DEPARTMENT_OTHER): Payer: Self-pay

## 2016-06-12 MED FILL — DICLOFENAC SODIUM 1% GEL: 1 | 13 days supply | Qty: 100 | Fill #1

## 2016-06-12 MED FILL — VIORELE 28 DAY TABLET: 0.15-0.02/0 | 28 days supply | Qty: 28 | Fill #6

## 2016-06-12 MED FILL — ESOMEPRAZOLE MAG DR 40 MG C: 40 | 30 days supply | Qty: 30 | Fill #5

## 2016-06-13 MED FILL — MELOXICAM 7.5 MG TABLET: 7.5 | 30 days supply | Qty: 30 | Fill #1

## 2016-06-16 DIAGNOSIS — K080 Exfoliation of teeth due to systemic causes: Secondary | ICD-10-CM | POA: Diagnosis not present

## 2016-06-19 ENCOUNTER — Ambulatory Visit (HOSPITAL_BASED_OUTPATIENT_CLINIC_OR_DEPARTMENT_OTHER)
Admission: RE | Admit: 2016-06-19 | Discharge: 2016-06-19 | Disposition: A | Discharge status: Home / Self Care | Payer: Federal, State, Local not specified - PPO | Source: Ambulatory Visit | Attending: Family Medicine | Admitting: Family Medicine

## 2016-06-19 DIAGNOSIS — Z1231 Encounter for screening mammogram for malignant neoplasm of breast: Secondary | ICD-10-CM | POA: Diagnosis not present

## 2016-07-16 ENCOUNTER — Other Ambulatory Visit: Payer: Self-pay | Admitting: Family Medicine

## 2016-07-16 MED FILL — ESOMEPRAZOLE MAG DR 40 MG C: 40 | 30 days supply | Qty: 30 | Fill #0

## 2016-07-16 MED FILL — VIORELE 28 DAY TABLET: 0.15-0.02/0 | 28 days supply | Qty: 28 | Fill #0

## 2016-07-23 ENCOUNTER — Encounter: Payer: Self-pay | Admitting: Family Medicine

## 2016-07-24 ENCOUNTER — Encounter: Payer: Self-pay | Admitting: Family Medicine

## 2016-08-08 MED FILL — RIZATRIPTAN 10 MG TABLET: 10 | 30 days supply | Qty: 18 | Fill #1

## 2016-08-13 MED FILL — VIORELE 28 DAY TABLET: 0.15-0.02/0 | 28 days supply | Qty: 28 | Fill #1

## 2016-09-09 DIAGNOSIS — F1910 Other psychoactive substance abuse, uncomplicated: Secondary | ICD-10-CM | POA: Diagnosis not present

## 2016-09-12 MED FILL — VIORELE 28 DAY TABLET: 0.15-0.02/0 | 28 days supply | Qty: 28 | Fill #2

## 2016-09-12 MED FILL — ESOMEPRAZOLE MAG DR 40 MG C: 40 | 30 days supply | Qty: 30 | Fill #1

## 2016-09-24 ENCOUNTER — Other Ambulatory Visit: Payer: Self-pay | Admitting: Physician Assistant

## 2016-09-24 DIAGNOSIS — K080 Exfoliation of teeth due to systemic causes: Secondary | ICD-10-CM | POA: Diagnosis not present

## 2016-09-24 MED FILL — RIZATRIPTAN 10 MG TABLET: 10 | 30 days supply | Qty: 18 | Fill #0

## 2016-11-07 IMAGING — CR DG LUMBAR SPINE COMPLETE 4+V
5 series · 5 of 5 positions shown · non-contrast
Comparison: Lumbar spine series of July 20, 2015 and abdominal
and pelvic CT scan dated April 23, 2015

CLINICAL DATA: Several months of low back pain without known
injury.

EXAM:
LUMBAR SPINE - COMPLETE 4+ VIEW

[t l-spine a.p.]
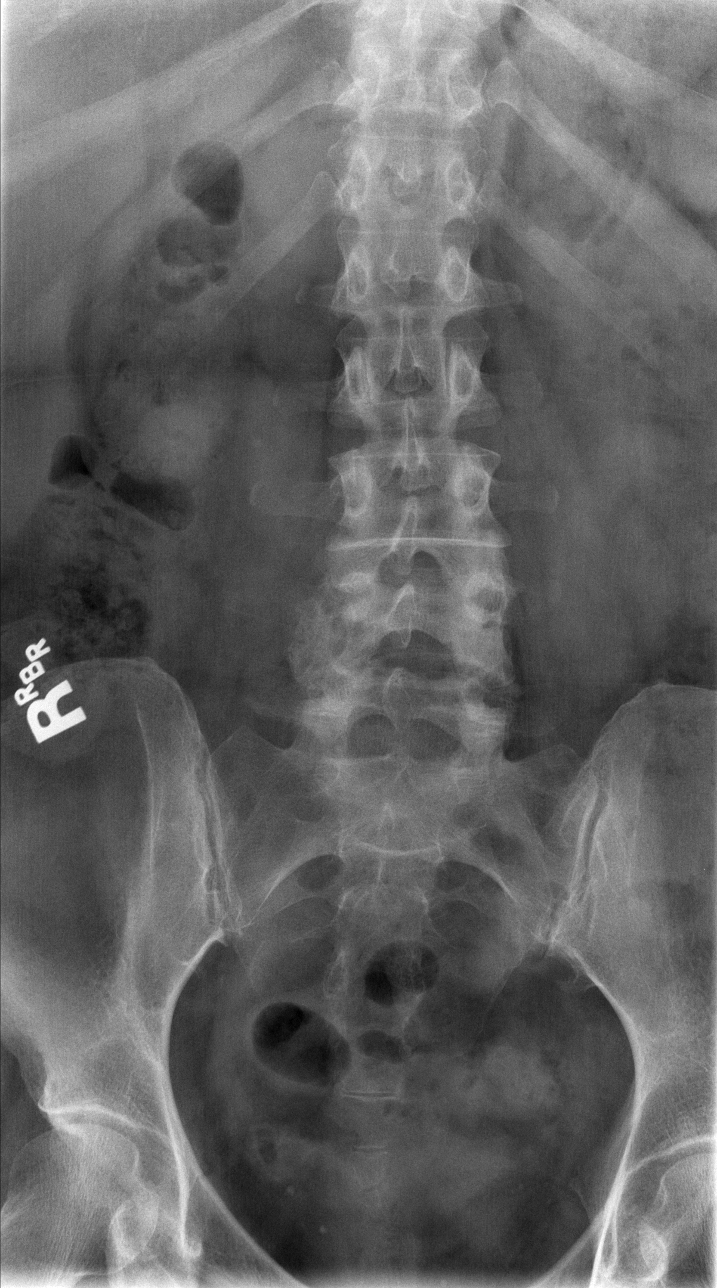

[t l-spine oblique exposure (1 of 2)]
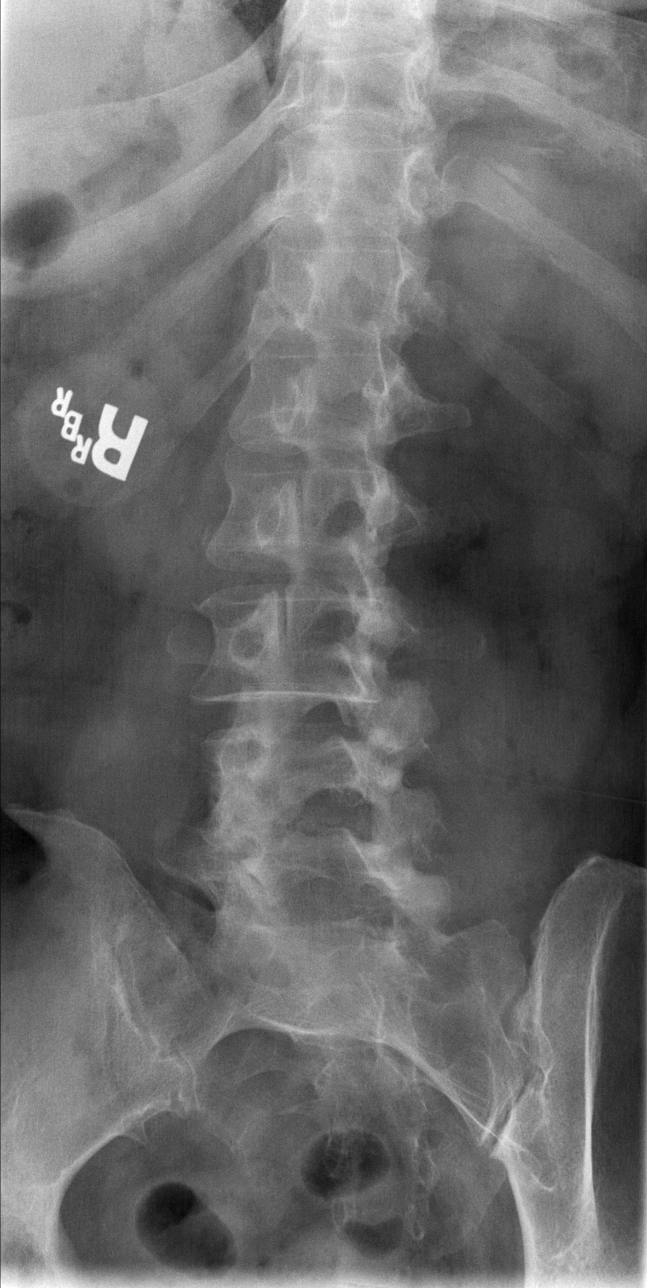

[t l-spine oblique exposure (2 of 2)]
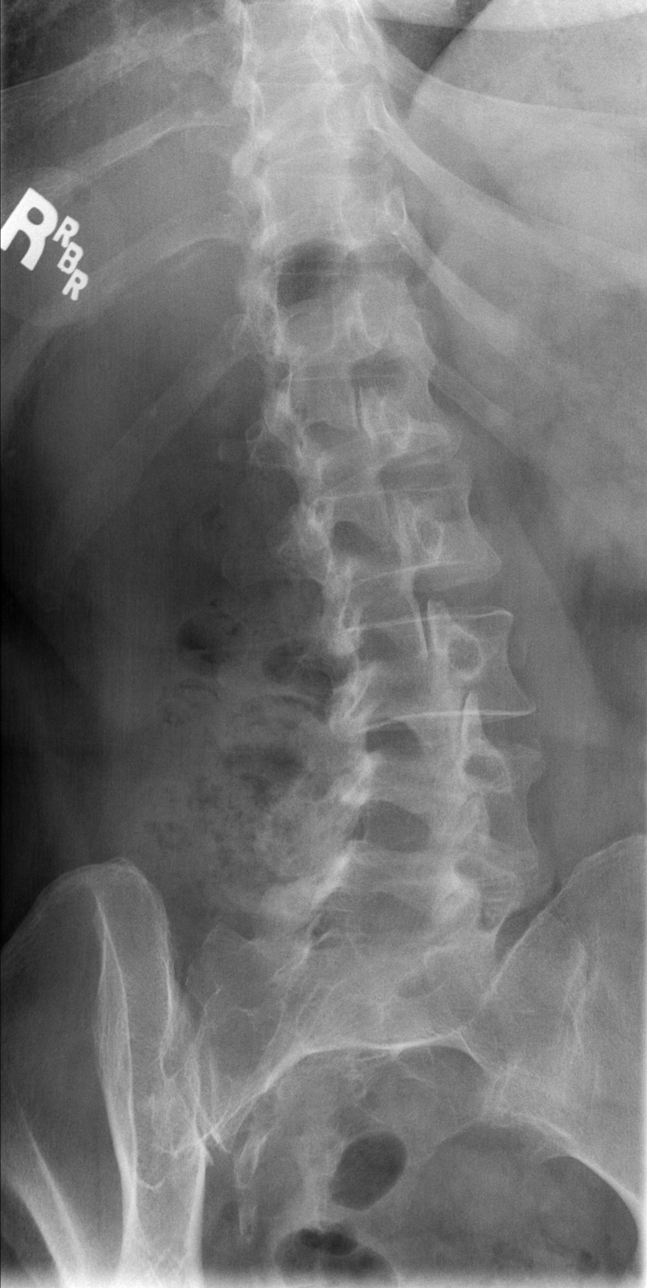

[t l-spine lat]
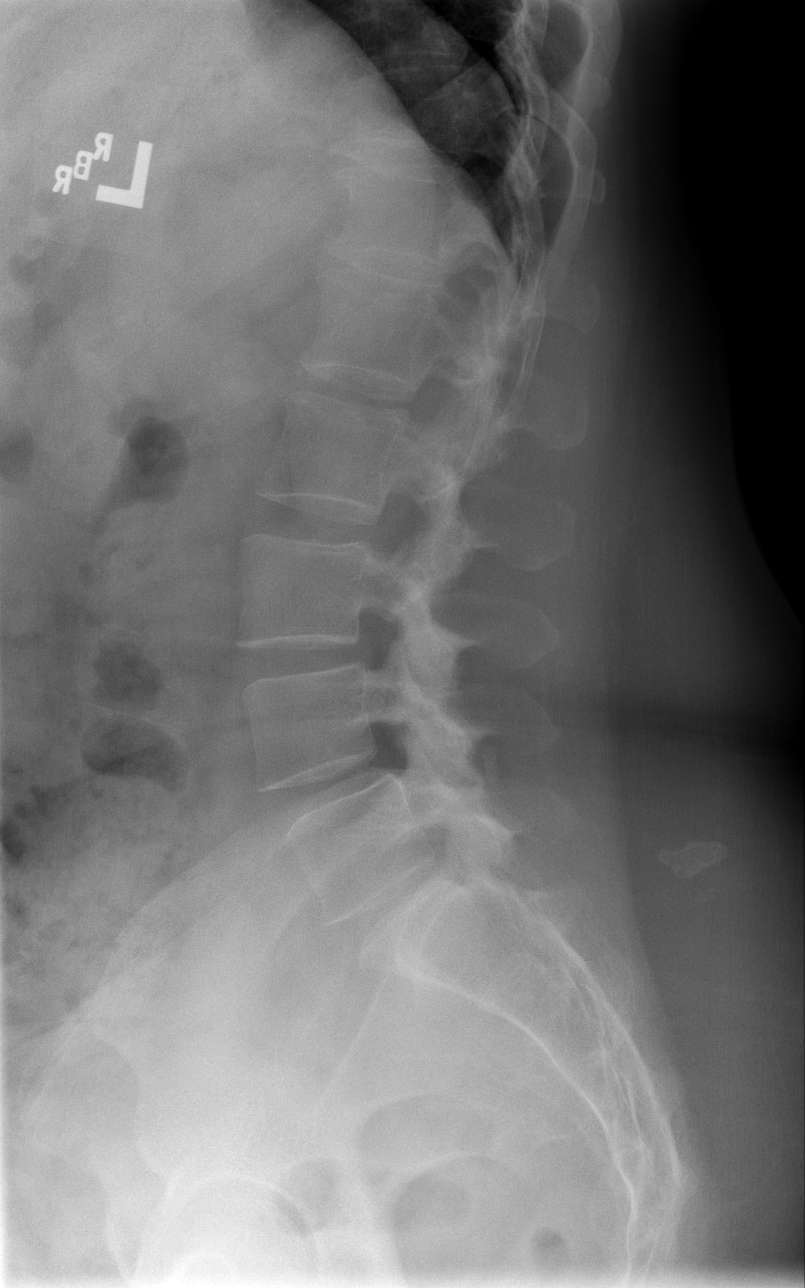

[t l-spine l5-s1 spot]
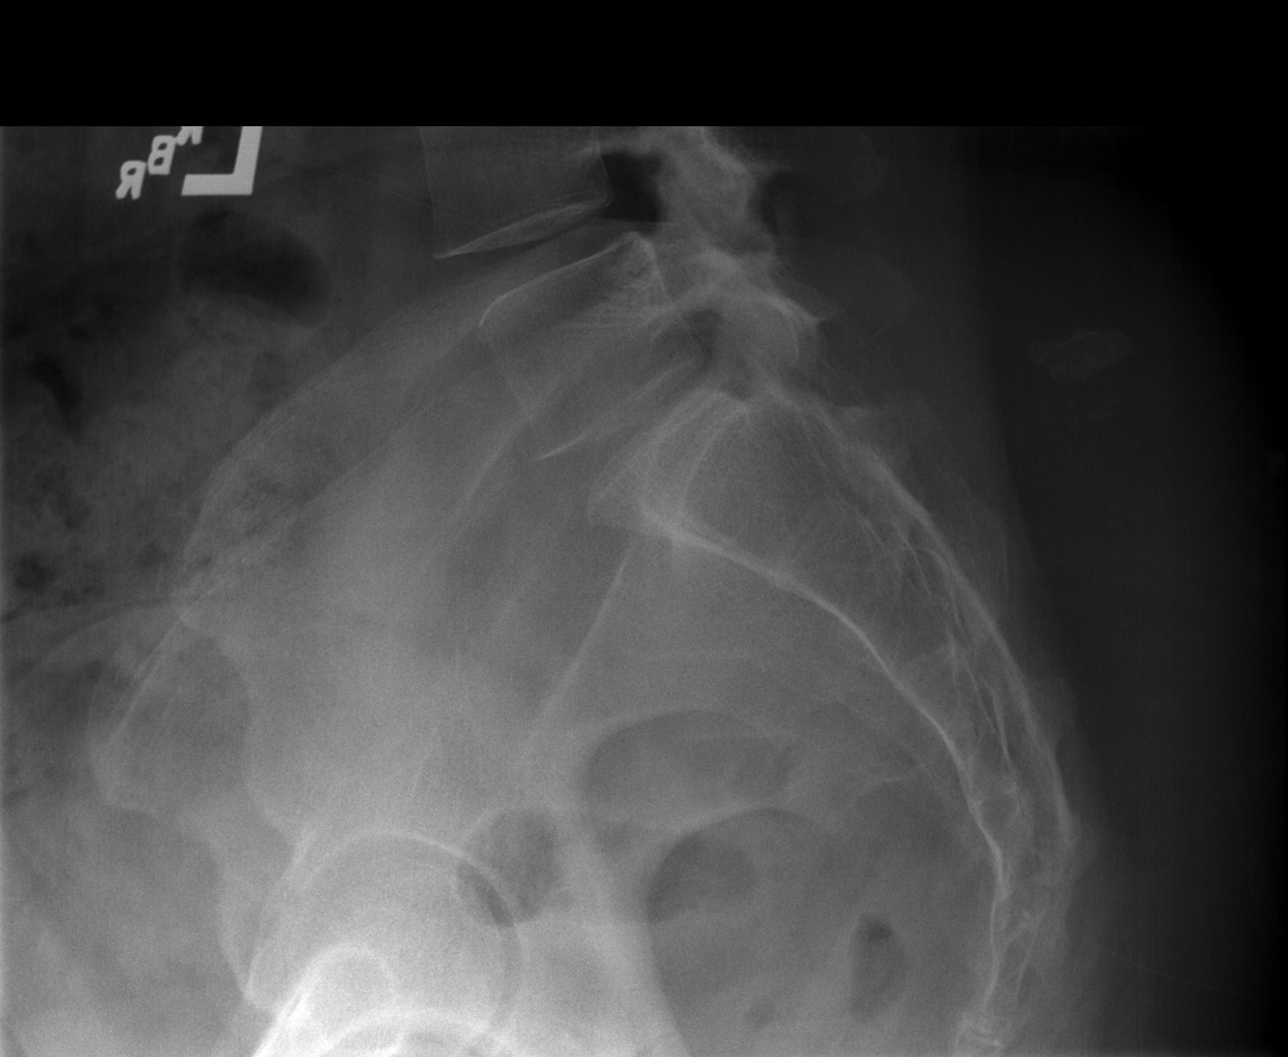

[5 of 5 positions shown; findings below may reference images not displayed]

FINDINGS: The lumbar vertebral bodies are preserved in height. The pedicles
and transverse processes are intact. There is facet joint
hypertrophy at L4-5 and L5-S1. There is minimal disc space narrowing
at L4-5. There is no spondylolisthesis.
IMPRESSION: There is no compression fracture nor other acute bony abnormality of
the lumbar spine. Mild degenerative narrowing at L4-5 has not
progressed since the previous study. There is no spondylolisthesis.

## 2016-11-10 ENCOUNTER — Ambulatory Visit (INDEPENDENT_AMBULATORY_CARE_PROVIDER_SITE_OTHER): Payer: Federal, State, Local not specified - PPO | Admitting: Family Medicine

## 2016-11-10 ENCOUNTER — Encounter: Payer: Self-pay | Admitting: Family Medicine

## 2016-11-10 VITALS — BP 124/82 | HR 84 | Temp 98.2°F | Ht 62.0 in | Wt 203.2 lb

## 2016-11-10 DIAGNOSIS — K219 Gastro-esophageal reflux disease without esophagitis: Secondary | ICD-10-CM | POA: Diagnosis not present

## 2016-11-10 DIAGNOSIS — I517 Cardiomegaly: Secondary | ICD-10-CM | POA: Diagnosis not present

## 2016-11-10 DIAGNOSIS — G43809 Other migraine, not intractable, without status migrainosus: Secondary | ICD-10-CM | POA: Diagnosis not present

## 2016-11-10 DIAGNOSIS — E782 Mixed hyperlipidemia: Secondary | ICD-10-CM

## 2016-11-10 DIAGNOSIS — E6609 Other obesity due to excess calories: Secondary | ICD-10-CM

## 2016-11-10 LAB — LIPID PANEL
CHOLESTEROL: 220 mg/dL — AB (ref 0–200)
HDL: 52.6 mg/dL (ref >39.00)
LDL Cholesterol: 129 mg/dL — ABNORMAL HIGH (ref 0–99)
NonHDL: 167.57
Total CHOL/HDL Ratio: 4
Triglycerides: 192 mg/dL — ABNORMAL HIGH (ref 0.0–149.0)
VLDL: 38.4 mg/dL (ref 0.0–40.0)

## 2016-11-10 LAB — COMPREHENSIVE METABOLIC PANEL
ALBUMIN: 4.1 g/dL (ref 3.5–5.2)
ALT: 29 U/L (ref 0–35)
AST: 26 U/L (ref 0–37)
Alkaline Phosphatase: 75 U/L (ref 39–117)
BUN: 9 mg/dL (ref 6–23)
CALCIUM: 9.3 mg/dL (ref 8.4–10.5)
CHLORIDE: 103 meq/L (ref 96–112)
CO2: 29 meq/L (ref 19–32)
CREATININE: 0.77 mg/dL (ref 0.40–1.20)
GFR: 85.61 mL/min (ref >60.00)
Glucose, Bld: 73 mg/dL (ref 70–99)
POTASSIUM: 4.2 meq/L (ref 3.5–5.1)
Sodium: 138 mEq/L (ref 135–145)
Total Bilirubin: 0.6 mg/dL (ref 0.2–1.2)
Total Protein: 7 g/dL (ref 6.0–8.3)

## 2016-11-10 LAB — CBC
HEMATOCRIT: 42 % (ref 36.0–46.0)
HEMOGLOBIN: 14.3 g/dL (ref 12.0–15.0)
MCHC: 34 g/dL (ref 30.0–36.0)
MCV: 87.1 fl (ref 78.0–100.0)
PLATELETS: 301 10*3/uL (ref 150.0–400.0)
RBC: 4.83 Mil/uL (ref 3.87–5.11)
RDW: 13.6 % (ref 11.5–15.5)
WBC: 7.1 10*3/uL (ref 4.0–10.5)

## 2016-11-10 LAB — TSH: TSH: 1.9 u[IU]/mL (ref 0.35–4.50)

## 2016-11-10 MED ORDER — RIZATRIPTAN BENZOATE 10 MG PO TABS: ORAL_TABLET | ORAL | 5 refills | Status: DC

## 2016-11-10 MED ORDER — PROPRANOLOL HCL 10 MG PO TABS: 10.0000 mg | ORAL_TABLET | Freq: Three times a day (TID) | ORAL | 3 refills | Status: DC

## 2016-11-10 MED ORDER — ONDANSETRON 4 MG PO TBDP: 4.0000 mg | ORAL_TABLET | Freq: Three times a day (TID) | ORAL | 0 refills | Status: DC | PRN

## 2016-11-10 MED FILL — ONDANSETRON ODT 4 MG TABLET: 4 | 5 days supply | Qty: 30 | Fill #0

## 2016-11-10 MED FILL — PROPRANOLOL 10 MG TABLET: 10 | 30 days supply | Qty: 90 | Fill #0

## 2016-11-10 MED FILL — ESOMEPRAZOLE MAG DR 40 MG C: 40 | 30 days supply | Qty: 30 | Fill #2

## 2016-11-10 MED FILL — RIZATRIPTAN 10 MG TABLET: 10 | 30 days supply | Qty: 18 | Fill #0

## 2016-11-10 NOTE — Progress Notes (Signed)
Patient ID: Lisa Ray, female   DOB: 10/24/1971, 46 y.o.   MRN: [id number]   Subjective:    Patient ID: Lisa Ray, female    DOB: 1971/03/07, 46 y.o.   MRN: [id number]  Chief Complaint  Patient presents with  . Follow-up    HPI Patient is in today for follow up. She is noting frequent migraines with photophobia, phonophobia, nausea and vomiting. Uses Maxalt and phenergan, causes some fatigue but does break the headaches. Is sleeping better since stopping Ambien and using a Breethe APP. The stress at home is improving so she feels less anxious. She is frustrated with weight gain despite cutting down on carbs significantly. Denies CP/palp/SOB/congestion/fevers/GI or GU c/o. Taking meds as prescribed Past Medical History:  Diagnosis Date  . Arthritis 06/18/2014  . Bronchitis 11/05/2011  . Chicken pox as a child   X 2  . Colon polyps 2008  . Cough 06/01/2012  . Depression with anxiety 04/08/2015  . Diverticulosis 2008  . Diverticulosis 03/25/2013  . Elevated BP 05/18/2016  . Erosive inflammation of stomach lining 2008  . Foot pain, right 10/13/2011  . GERD (gastroesophageal reflux disease) 2008  . Kidney stones 7-12   X 3  . Left shoulder pain 01/04/2012  . LVH (left ventricular hypertrophy) 05/18/2016   Echo June 2017 Mild LVH, mild MVR, stage 1 diastolic dysfunction  . Migraines   . Obesity 10/08/2011  . Ovarian cyst, right   . Preventative health care 10/13/2011  . Skin tag 06/01/2012  . Solitary pulmonary nodule 03/25/2013    Past Surgical History:  Procedure Laterality Date  . LITHOTRIPSY  2008  . OVARIAN CYST REMOVAL    . stent for kidney stone  7-12  . TONSILECTOMY, ADENOIDECTOMY, BILATERAL MYRINGOTOMY AND TUBES     Age 82    Family History  Problem Relation Age of Onset  . Hypertension Mother   . Cancer Mother     skin, melanoma  . Migraines Mother   . Heart disease Maternal Grandmother   . Colon cancer Maternal Grandmother   . Colon cancer Maternal  Grandfather   . Colon cancer Maternal Grandfather   . Bipolar disorder Daughter     Social History   Social History  . Marital status: Married    Spouse name: Desirie Minteer  . Number of children: 3  . Years of education: Assoc degr   Occupational History  . human resources     [employer]    Social History Main Topics  . Smoking status: Never Smoker  . Smokeless tobacco: Never Used  . Alcohol use 1.5 oz/week    3 drink(s) per week     Comment: occasionally  . Drug use: No  . Sexual activity: Yes    Partners: Male   Other Topics Concern  . Not on file   Social History Narrative   Lives with her partner Ashlynne Shetterly.           Outpatient Medications Prior to Visit  Medication Sig Dispense Refill  . diclofenac sodium (VOLTAREN) 1 % GEL Apply 2 g topically 4 (four) times daily. 100 g 1  . meloxicam (MOBIC) 7.5 MG tablet Take 1 tablet (7.5 mg total) by mouth daily as needed for pain. 30 tablet 5  . omeprazole (PRILOSEC) 40 MG capsule Take 40 mg by mouth daily.    . Probiotic Product (PROBIOTIC DAILY PO) Take by mouth daily.    . promethazine (PHENERGAN) 25 MG tablet Take 1 tablet (25  mg total) by mouth every 8 (eight) hours as needed for nausea or vomiting. 20 tablet 0  . tiZANidine (ZANAFLEX) 4 MG tablet Take 1 tablet (4 mg total) by mouth every 6 (six) hours as needed for muscle spasms. 30 tablet 0  . rizatriptan (MAXALT) 10 MG tablet TAKE 1 TABLET BY MOUTH AS NEEDED. MAY REPEAT ONCE IN 2 HOURS IF NEEDED 18 tablet 1  . VIORELE 0.15-0.02/0.01 MG (21/5) tablet TAKE 1 TABLET BY MOUTH DAILY 28 tablet 6  . venlafaxine XR (EFFEXOR XR) 37.5 MG 24 hr capsule Take 1 capsule (37.5 mg total) by mouth daily with breakfast. 30 capsule 3  . zolpidem (AMBIEN) 10 MG tablet Take 1 tablet (10 mg total) by mouth at bedtime as needed for sleep. 30 tablet 0   No facility-administered medications prior to visit.     No Known Allergies  Review of Systems  Constitutional: Positive for  malaise/fatigue. Negative for fever.  HENT: Negative for congestion.   Eyes: Positive for photophobia. Negative for blurred vision.  Respiratory: Negative for shortness of breath.   Cardiovascular: Negative for chest pain, palpitations and leg swelling.  Gastrointestinal: Negative for abdominal pain, blood in stool and nausea.  Genitourinary: Negative for dysuria and frequency.  Musculoskeletal: Negative for falls.  Skin: Negative for rash.  Neurological: Positive for headaches. Negative for dizziness and loss of consciousness.  Endo/Heme/Allergies: Negative for environmental allergies.  Psychiatric/Behavioral: Negative for depression. The patient is not nervous/anxious.        Objective:    Physical Exam  Constitutional: She is oriented to person, place, and time. She appears well-developed and well-nourished. No distress.  HENT:  Head: Normocephalic and atraumatic.  Nose: Nose normal.  Eyes: Right eye exhibits no discharge. Left eye exhibits no discharge.  Neck: Normal range of motion. Neck supple.  Cardiovascular: Normal rate and regular rhythm.   No murmur heard. Pulmonary/Chest: Effort normal and breath sounds normal.  Abdominal: Soft. Bowel sounds are normal. There is no tenderness.  Musculoskeletal: She exhibits no edema.  Neurological: She is alert and oriented to person, place, and time.  Skin: Skin is warm and dry.  Psychiatric: She has a normal mood and affect.  Nursing note and vitals reviewed.   BP 124/82 (BP Location: Left Arm, Patient Position: Sitting, Cuff Size: Normal)   Pulse 84   Temp 98.2 F (36.8 C) (Oral)   Ht 5\' 2"  (1.575 m)   Wt 203 lb 3.2 oz (92.2 kg)   LMP 10/28/2016   SpO2 97%   BMI 37.17 kg/m  Wt Readings from Last 3 Encounters:  11/10/16 203 lb 3.2 oz (92.2 kg)  05/08/16 194 lb 8 oz (88.2 kg)  03/28/16 196 lb 6 oz (89.1 kg)     Lab Results  Component Value Date   WBC 7.3 03/11/2016   HGB 13.1 03/11/2016   HCT 40.2 03/11/2016   PLT  273.0 03/11/2016   GLUCOSE 95 03/11/2016   CHOL 196 04/03/2015   TRIG 160.0 (H) 04/03/2015   HDL 53.80 04/03/2015   LDLCALC 110 (H) 04/03/2015   ALT 12 03/11/2016   AST 16 03/11/2016   NA 138 03/11/2016   K 4.4 03/11/2016   CL 106 03/11/2016   CREATININE 0.83 03/11/2016   BUN 11 03/11/2016   CO2 25 03/11/2016   TSH 2.36 03/11/2016    Lab Results  Component Value Date   TSH 2.36 03/11/2016   Lab Results  Component Value Date   WBC 7.3 03/11/2016  HGB 13.1 03/11/2016   HCT 40.2 03/11/2016   MCV 85.5 03/11/2016   PLT 273.0 03/11/2016   Lab Results  Component Value Date   NA 138 03/11/2016   K 4.4 03/11/2016   CO2 25 03/11/2016   GLUCOSE 95 03/11/2016   BUN 11 03/11/2016   CREATININE 0.83 03/11/2016   BILITOT 0.4 03/11/2016   ALKPHOS 65 03/11/2016   AST 16 03/11/2016   ALT 12 03/11/2016   PROT 6.8 03/11/2016   ALBUMIN 4.1 03/11/2016   CALCIUM 9.0 03/11/2016   GFR 78.74 03/11/2016   Lab Results  Component Value Date   CHOL 196 04/03/2015   Lab Results  Component Value Date   HDL 53.80 04/03/2015   Lab Results  Component Value Date   LDLCALC 110 (H) 04/03/2015   Lab Results  Component Value Date   TRIG 160.0 (H) 04/03/2015   Lab Results  Component Value Date   CHOLHDL 4 04/03/2015   No results found for: HGBA1C     Assessment & Plan:   Problem List Items Addressed This Visit    None      I have discontinued Ms. Polack's zolpidem and venlafaxine XR. I am also having her start on ondansetron. Additionally, I am having her maintain her Probiotic Product (PROBIOTIC DAILY PO), promethazine, omeprazole, diclofenac sodium, tiZANidine, meloxicam, VIORELE, and rizatriptan.  Meds ordered this encounter  Medications  . rizatriptan (MAXALT) 10 MG tablet    Sig: TAKE 1 TABLET BY MOUTH AS NEEDED. MAY REPEAT ONCE IN 2 HOURS IF NEEDED    Dispense:  18 tablet    Refill:  5  . ondansetron (ZOFRAN-ODT) 4 MG disintegrating tablet    Sig: Take 1-2 tablets  (4-8 mg total) by mouth every 8 (eight) hours as needed for nausea or vomiting.    Dispense:  30 tablet    Refill:  0    Danise Edge, MD

## 2016-11-10 NOTE — Progress Notes (Signed)
Pre visit review using our clinic review tool, if applicable. No additional management support is needed unless otherwise documented below in the visit note. 

## 2016-11-10 NOTE — Assessment & Plan Note (Signed)
Avoid offending foods, start probiotics. Do not eat large meals in late evening and consider raising head of bed. Continue Omeprazole, raise head of bed

## 2016-11-10 NOTE — Patient Instructions (Signed)
Migraine Headache A migraine headache is an intense, throbbing pain on one side or both sides of the head. Migraines may also cause other symptoms, such as nausea, vomiting, and sensitivity to light and noise. What are the causes? Doing or taking certain things may also trigger migraines, such as:  Alcohol.  Smoking.  Medicines, such as: ? Medicine used to treat chest pain (nitroglycerine). ? Birth control pills. ? Estrogen pills. ? Certain blood pressure medicines.  Aged cheeses, chocolate, or caffeine.  Foods or drinks that contain nitrates, glutamate, aspartame, or tyramine.  Physical activity.  Other things that may trigger a migraine include:  Menstruation.  Pregnancy.  Hunger.  Stress, lack of sleep, too much sleep, or fatigue.  Weather changes.  What increases the risk? The following factors may make you more likely to experience migraine headaches:  Age. Risk increases with age.  Family history of migraine headaches.  Being Caucasian.  Depression and anxiety.  Obesity.  Being a woman.  Having a hole in the heart (patent foramen ovale) or other heart problems.  What are the signs or symptoms? The main symptom of this condition is pulsating or throbbing pain. Pain may:  Happen in any area of the head, such as on one side or both sides.  Interfere with daily activities.  Get worse with physical activity.  Get worse with exposure to bright lights or loud noises.  Other symptoms may include:  Nausea.  Vomiting.  Dizziness.  General sensitivity to bright lights, loud noises, or smells.  Before you get a migraine, you may get warning signs that a migraine is developing (aura). An aura may include:  Seeing flashing lights or having blind spots.  Seeing bright spots, halos, or zigzag lines.  Having tunnel vision or blurred vision.  Having numbness or a tingling feeling.  Having trouble talking.  Having muscle weakness.  How is this  diagnosed? A migraine headache can be diagnosed based on:  Your symptoms.  A physical exam.  Tests, such as CT scan or MRI of the head. These imaging tests can help rule out other causes of headaches.  Taking fluid from the spine (lumbar puncture) and analyzing it (cerebrospinal fluid analysis, or CSF analysis).  How is this treated? A migraine headache is usually treated with medicines that:  Relieve pain.  Relieve nausea.  Prevent migraines from coming back.  Treatment may also include:  Acupuncture.  Lifestyle changes like avoiding foods that trigger migraines.  Follow these instructions at home: Medicines  Take over-the-counter and prescription medicines only as told by your health care provider.  Do not drive or use heavy machinery while taking prescription pain medicine.  To prevent or treat constipation while you are taking prescription pain medicine, your health care provider may recommend that you: ? Drink enough fluid to keep your urine clear or pale yellow. ? Take over-the-counter or prescription medicines. ? Eat foods that are high in fiber, such as fresh fruits and vegetables, whole grains, and beans. ? Limit foods that are high in fat and processed sugars, such as fried and sweet foods. Lifestyle  Avoid alcohol use.  Do not use any products that contain nicotine or tobacco, such as cigarettes and e-cigarettes. If you need help quitting, ask your health care provider.  Get at least 8 hours of sleep every night.  Limit your stress. General instructions   Keep a journal to find out what may trigger your migraine headaches. For example, write down: ? What you eat and   drink. ? How much sleep you get. ? Any change to your diet or medicines.  If you have a migraine: ? Avoid things that make your symptoms worse, such as bright lights. ? It may help to lie down in a dark, quiet room. ? Do not drive or use heavy machinery. ? Ask your health care provider  what activities are safe for you while you are experiencing symptoms.  Keep all follow-up visits as told by your health care provider. This is important. Contact a health care provider if:  You develop symptoms that are different or more severe than your usual migraine symptoms. Get help right away if:  Your migraine becomes severe.  You have a fever.  You have a stiff neck.  You have vision loss.  Your muscles feel weak or like you cannot control them.  You start to lose your balance often.  You develop trouble walking.  You faint. This information is not intended to replace advice given to you by your health care provider. Make sure you discuss any questions you have with your health care provider. Document Released: 10/20/2005 Document Revised: 05/09/2016 Document Reviewed: 04/07/2016 Elsevier Interactive Patient Education  2017 [employer]   

## 2016-11-10 NOTE — Assessment & Plan Note (Signed)
Encouraged DASH diet, decrease po intake and increase exercise as tolerated. Needs 7-8 hours of sleep nightly. Avoid trans fats, eat small, frequent meals every 4-5 hours with lean proteins, complex carbs and healthy fats. Minimize simple carbs. Referred to bariatrics for further consideration.  

## 2016-11-10 NOTE — Assessment & Plan Note (Signed)
Encouraged heart healthy diet, increase exercise, avoid trans fats, consider a krill oil cap daily. Check lipids today 

## 2016-12-17 DIAGNOSIS — R52 Pain, unspecified: Secondary | ICD-10-CM | POA: Diagnosis not present

## 2016-12-17 DIAGNOSIS — L820 Inflamed seborrheic keratosis: Secondary | ICD-10-CM | POA: Diagnosis not present

## 2016-12-17 DIAGNOSIS — D2261 Melanocytic nevi of right upper limb, including shoulder: Secondary | ICD-10-CM | POA: Diagnosis not present

## 2016-12-18 DIAGNOSIS — M545 Low back pain: Secondary | ICD-10-CM | POA: Diagnosis not present

## 2017-01-01 ENCOUNTER — Other Ambulatory Visit: Payer: Self-pay | Admitting: Family Medicine

## 2017-01-01 MED FILL — RIZATRIPTAN 10 MG TABLET: 10 | 30 days supply | Qty: 18 | Fill #1

## 2017-01-01 MED FILL — ONDANSETRON ODT 4 MG TABLET: 4 | 5 days supply | Qty: 30 | Fill #0

## 2017-01-27 ENCOUNTER — Encounter: Payer: Self-pay | Admitting: Family Medicine

## 2017-04-01 DIAGNOSIS — K080 Exfoliation of teeth due to systemic causes: Secondary | ICD-10-CM | POA: Diagnosis not present

## 2017-04-10 MED FILL — RIZATRIPTAN 10 MG TABLET: 10 | 30 days supply | Qty: 18 | Fill #2

## 2017-04-10 MED FILL — DESOGESTR-ETH ESTRAD ETH ES: 0.15-0.02/0 | 28 days supply | Qty: 28 | Fill #3

## 2017-07-16 MED FILL — RIZATRIPTAN 10 MG TABLET: 10 | 30 days supply | Qty: 18 | Fill #3

## 2017-07-20 ENCOUNTER — Ambulatory Visit (INDEPENDENT_AMBULATORY_CARE_PROVIDER_SITE_OTHER): Payer: Federal, State, Local not specified - PPO

## 2017-07-20 ENCOUNTER — Ambulatory Visit (INDEPENDENT_AMBULATORY_CARE_PROVIDER_SITE_OTHER): Payer: Federal, State, Local not specified - PPO | Admitting: Podiatry

## 2017-07-20 ENCOUNTER — Encounter: Payer: Self-pay | Admitting: Podiatry

## 2017-07-20 VITALS — BP 129/82 | HR 59 | Resp 16

## 2017-07-20 DIAGNOSIS — G5763 Lesion of plantar nerve, bilateral lower limbs: Secondary | ICD-10-CM | POA: Diagnosis not present

## 2017-07-20 DIAGNOSIS — M79672 Pain in left foot: Secondary | ICD-10-CM

## 2017-07-20 DIAGNOSIS — M79671 Pain in right foot: Secondary | ICD-10-CM

## 2017-07-20 NOTE — Progress Notes (Signed)
   Subjective:    Patient ID: Lisa Ray, female    DOB: 09-Jun-1971, 46 y.o.   MRN: [id number]  HPI    Review of Systems  Constitutional: Positive for activity change.  Musculoskeletal: Positive for back pain.  All other systems reviewed and are negative.      Objective:   Physical Exam        Assessment & Plan:

## 2017-07-22 NOTE — Progress Notes (Signed)
   HPI: Patient is a 46 year old [id number] presenting today as a new patient with a complaint of pain in the bilateral forefeet that began 2-3 months ago. She reports associated calluses in the area of pain. Walking for long periods of time increase the pain. She has not done anything to treat the symptoms. She is here for further evaluation and treatment.   Physical Exam: General: The patient is alert and oriented x3 in no acute distress.  Dermatology: Skin is warm, dry and supple bilateral lower extremities. Negative for open lesions or macerations.  Vascular: Palpable pedal pulses bilaterally. No edema or erythema noted. Capillary refill within normal limits.  Neurological: Epicritic and protective threshold grossly intact bilaterally.   Musculoskeletal Exam: Sharp pain with palpation of the 2nd interspace and lateral compression of the metatarsal heads consistent with neuroma bilaterally. Positive Lendell Caprice sign with loadbearing of the forefoot.  Radiographic Exam:  Normal osseous mineralization. Joint spaces preserved. No fracture/dislocation/boney destruction.    Assessment: 1.  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 Patient was evaluated. X-Rays reviewed. 2. Appt with Raiford Noble for custom molded orthotics. 3. Offered injection, patient declined.  4. Continue Altra shoes. 5. Return to clinic when necessary.   Felecia Shelling, DPM [facility]  Dr. Felecia Shelling, DPM    [address]                                        [place], [place] [address]                Office [phone]  Fax [phone]

## 2017-08-03 ENCOUNTER — Ambulatory Visit (INDEPENDENT_AMBULATORY_CARE_PROVIDER_SITE_OTHER): Payer: Federal, State, Local not specified - PPO | Admitting: Orthotics

## 2017-08-03 DIAGNOSIS — G5763 Lesion of plantar nerve, bilateral lower limbs: Secondary | ICD-10-CM | POA: Diagnosis not present

## 2017-08-03 NOTE — Progress Notes (Signed)
Patient came in today for casting morton's neuroma bilateral.   Patient has pain in 2/3 interspace after walking a distance.  Plan on Richy to fab atheltic type CFO, with topcover unglued distally so pad can be moved around for best possible patient outcome.

## 2017-08-21 ENCOUNTER — Encounter: Payer: Self-pay | Admitting: Family Medicine

## 2017-08-21 ENCOUNTER — Ambulatory Visit (INDEPENDENT_AMBULATORY_CARE_PROVIDER_SITE_OTHER): Payer: Federal, State, Local not specified - PPO | Admitting: Family Medicine

## 2017-08-21 DIAGNOSIS — E782 Mixed hyperlipidemia: Secondary | ICD-10-CM | POA: Diagnosis not present

## 2017-08-21 DIAGNOSIS — K219 Gastro-esophageal reflux disease without esophagitis: Secondary | ICD-10-CM | POA: Diagnosis not present

## 2017-08-21 DIAGNOSIS — K649 Unspecified hemorrhoids: Secondary | ICD-10-CM

## 2017-08-21 DIAGNOSIS — E6609 Other obesity due to excess calories: Secondary | ICD-10-CM | POA: Diagnosis not present

## 2017-08-21 DIAGNOSIS — F418 Other specified anxiety disorders: Secondary | ICD-10-CM | POA: Diagnosis not present

## 2017-08-21 DIAGNOSIS — G43909 Migraine, unspecified, not intractable, without status migrainosus: Secondary | ICD-10-CM | POA: Diagnosis not present

## 2017-08-21 DIAGNOSIS — M1990 Unspecified osteoarthritis, unspecified site: Secondary | ICD-10-CM

## 2017-08-21 HISTORY — DX: Unspecified hemorrhoids: K64.9

## 2017-08-21 LAB — TSH: TSH: 1.8 u[IU]/mL (ref 0.35–4.50)

## 2017-08-21 LAB — CBC
HCT: 39.1 % (ref 36.0–46.0)
Hemoglobin: 12.8 g/dL (ref 12.0–15.0)
MCHC: 32.7 g/dL (ref 30.0–36.0)
MCV: 91.1 fl (ref 78.0–100.0)
Platelets: 237 10*3/uL (ref 150.0–400.0)
RBC: 4.29 Mil/uL (ref 3.87–5.11)
RDW: 14 % (ref 11.5–15.5)
WBC: 6.4 10*3/uL (ref 4.0–10.5)

## 2017-08-21 LAB — COMPREHENSIVE METABOLIC PANEL
ALBUMIN: 3.9 g/dL (ref 3.5–5.2)
ALK PHOS: 56 U/L (ref 39–117)
ALT: 13 U/L (ref 0–35)
AST: 16 U/L (ref 0–37)
BUN: 9 mg/dL (ref 6–23)
CO2: 27 mEq/L (ref 19–32)
CREATININE: 0.76 mg/dL (ref 0.40–1.20)
Calcium: 9 mg/dL (ref 8.4–10.5)
Chloride: 103 mEq/L (ref 96–112)
GFR: 86.62 mL/min (ref >60.00)
GLUCOSE: 89 mg/dL (ref 70–99)
Potassium: 4 mEq/L (ref 3.5–5.1)
SODIUM: 137 meq/L (ref 135–145)
TOTAL PROTEIN: 6.3 g/dL (ref 6.0–8.3)
Total Bilirubin: 0.5 mg/dL (ref 0.2–1.2)

## 2017-08-21 LAB — LIPID PANEL
CHOLESTEROL: 159 mg/dL (ref 0–200)
HDL: 56 mg/dL (ref >39.00)
LDL Cholesterol: 82 mg/dL (ref 0–99)
NONHDL: 103.35
Total CHOL/HDL Ratio: 3
Triglycerides: 109 mg/dL (ref 0.0–149.0)
VLDL: 21.8 mg/dL (ref 0.0–40.0)

## 2017-08-21 MED ORDER — RIZATRIPTAN BENZOATE 10 MG PO TABS: ORAL_TABLET | ORAL | 5 refills | Status: AC

## 2017-08-21 NOTE — Progress Notes (Signed)
Subjective:  I acted as a Neurosurgeon for Dr. Abner Greenspan. Lisa Ray, [name]  Patient ID: Lisa Ray, female    DOB: [birth year], 46 y.o.   MRN: [id number]  No chief complaint on file.   HPI  Patient is in today for a follow up and she feels much better with her increased exercise and improved diet. She has lost a good deal of weight and is walking up to 15000 steps daily. She is doing intermittent fasting and eating a heart healthy diet. She is resting better and her reflux has also improved. Denies CP/palp/SOB/HA/congestion/fevers/GI or GU c/o. Taking meds as prescribed  Patient Care Team: Bradd Canary, MD as PCP - General (Family Medicine)   Past Medical History:  Diagnosis Date  . Arthritis 06/18/2014  . Bronchitis 11/05/2011  . Chicken pox as a child   X 2  . Colon polyps 2008  . Cough 06/01/2012  . Depression with anxiety 04/08/2015  . Diverticulosis 2008  . Diverticulosis 03/25/2013  . Elevated BP 05/18/2016  . Erosive inflammation of stomach lining 2008  . Foot pain, right 10/13/2011  . GERD (gastroesophageal reflux disease) 2008  . Hemorrhoids 08/21/2017  . Kidney stones 7-12   X 3  . Left shoulder pain 01/04/2012  . LVH (left ventricular hypertrophy) 05/18/2016   Echo June 2017 Mild LVH, mild MVR, stage 1 diastolic dysfunction  . Migraines   . Obesity 10/08/2011  . Ovarian cyst, right   . Preventative health care 10/13/2011  . Skin tag 06/01/2012  . Solitary pulmonary nodule 03/25/2013    Past Surgical History:  Procedure Laterality Date  . LITHOTRIPSY  2008  . OVARIAN CYST REMOVAL    . stent for kidney stone  7-12  . TONSILECTOMY, ADENOIDECTOMY, BILATERAL MYRINGOTOMY AND TUBES     Age 64    Family History  Problem Relation Age of Onset  . Hypertension Mother   . Cancer Mother        skin, melanoma  . Migraines Mother   . Heart disease Maternal Grandmother   . Colon cancer Maternal Grandmother   . Colon cancer Maternal Grandfather   . Colon cancer Maternal  Grandfather   . Bipolar disorder Daughter     Social History   Social History  . Marital status: Married    Spouse name: Almarosa Bohac  . Number of children: 3  . Years of education: Assoc degr   Occupational History  . human resources     [employer]    Social History Main Topics  . Smoking status: Never Smoker  . Smokeless tobacco: Never Used  . Alcohol use 1.5 oz/week    3 drink(s) per week     Comment: occasionally  . Drug use: No  . Sexual activity: Yes    Partners: Male   Other Topics Concern  . Not on file   Social History Narrative   Lives with her partner Lisa Ray.           Outpatient Medications Prior to Visit  Medication Sig Dispense Refill  . Naproxen Sodium (ALEVE PO) Take by mouth.    . Probiotic Product (PROBIOTIC DAILY PO) Take by mouth daily.    . rizatriptan (MAXALT) 10 MG tablet TAKE 1 TABLET BY MOUTH AS NEEDED. MAY REPEAT ONCE IN 2 HOURS IF NEEDED 18 tablet 5   No facility-administered medications prior to visit.     No Known Allergies  Review of Systems  Constitutional: Negative for fever and malaise/fatigue.  HENT:  Negative for congestion.   Eyes: Negative for blurred vision.  Respiratory: Negative for cough and shortness of breath.   Cardiovascular: Negative for chest pain, palpitations and leg swelling.  Gastrointestinal: Negative for vomiting.  Musculoskeletal: Negative for back pain.  Skin: Negative for rash.  Neurological: Negative for loss of consciousness and headaches.       Objective:    Physical Exam  Constitutional: She is oriented to person, place, and time. She appears well-developed and well-nourished. No distress.  HENT:  Head: Normocephalic and atraumatic.  Eyes: Conjunctivae are normal.  Neck: Normal range of motion. No thyromegaly present.  Cardiovascular: Normal rate and regular rhythm.   Pulmonary/Chest: Effort normal and breath sounds normal. She has no wheezes.  Abdominal: Soft. Bowel sounds are normal.  There is no tenderness.  Musculoskeletal: Normal range of motion. She exhibits no edema or deformity.  Neurological: She is alert and oriented to person, place, and time.  Skin: Skin is warm and dry. She is not diaphoretic.  Psychiatric: She has a normal mood and affect.    BP 102/62 (BP Location: Left Arm, Patient Position: Sitting, Cuff Size: Normal)   Pulse 71   Temp 97.8 F (36.6 C) (Oral)   Resp 18   Wt 167 lb 3.2 oz (75.8 kg)   SpO2 98%   BMI 30.58 kg/m  Wt Readings from Last 3 Encounters:  08/21/17 167 lb 3.2 oz (75.8 kg)  11/10/16 203 lb 3.2 oz (92.2 kg)  05/08/16 194 lb 8 oz (88.2 kg)   BP Readings from Last 3 Encounters:  08/21/17 102/62  07/20/17 129/82  11/10/16 124/82     Immunization History  Administered Date(s) Administered  . Influenza Whole 08/04/2011  . Influenza,inj,Quad PF,6+ Mos 07/12/2013  . Influenza,inj,Quad PF,6-35 Mos 08/18/2017  . Influenza-Unspecified 08/04/2015  . Tdap 11/03/2006, 10/08/2011    Health Maintenance  Topic Date Due  . HIV Screening  06/05/1985  . PAP SMEAR  01/03/2017  . TETANUS/TDAP  10/07/2021  . INFLUENZA VACCINE  Addressed    Lab Results  Component Value Date   WBC 6.4 08/21/2017   HGB 12.8 08/21/2017   HCT 39.1 08/21/2017   PLT 237.0 08/21/2017   GLUCOSE 89 08/21/2017   CHOL 159 08/21/2017   TRIG 109.0 08/21/2017   HDL 56.00 08/21/2017   LDLCALC 82 08/21/2017   ALT 13 08/21/2017   AST 16 08/21/2017   NA 137 08/21/2017   K 4.0 08/21/2017   CL 103 08/21/2017   CREATININE 0.76 08/21/2017   BUN 9 08/21/2017   CO2 27 08/21/2017   TSH 1.80 08/21/2017    Lab Results  Component Value Date   TSH 1.80 08/21/2017   Lab Results  Component Value Date   WBC 6.4 08/21/2017   HGB 12.8 08/21/2017   HCT 39.1 08/21/2017   MCV 91.1 08/21/2017   PLT 237.0 08/21/2017   Lab Results  Component Value Date   NA 137 08/21/2017   K 4.0 08/21/2017   CO2 27 08/21/2017   GLUCOSE 89 08/21/2017   BUN 9 08/21/2017    CREATININE 0.76 08/21/2017   BILITOT 0.5 08/21/2017   ALKPHOS 56 08/21/2017   AST 16 08/21/2017   ALT 13 08/21/2017   PROT 6.3 08/21/2017   ALBUMIN 3.9 08/21/2017   CALCIUM 9.0 08/21/2017   GFR 86.62 08/21/2017   Lab Results  Component Value Date   CHOL 159 08/21/2017   Lab Results  Component Value Date   HDL 56.00 08/21/2017   Lab  Results  Component Value Date   LDLCALC 82 08/21/2017   Lab Results  Component Value Date   TRIG 109.0 08/21/2017   Lab Results  Component Value Date   CHOLHDL 3 08/21/2017   No results found for: HGBA1C       Assessment & Plan:   Problem List Items Addressed This Visit    Migraines    Encouraged increased hydration, 64 ounces of clear fluids daily. Minimize alcohol and caffeine. Eat small frequent meals with lean proteins and complex carbs. Avoid high and low blood sugars. Get adequate sleep, 7-8 hours a night. Needs exercise daily preferably in the morning.      Relevant Medications   rizatriptan (MAXALT) 10 MG tablet   Other Relevant Orders   CBC (Completed)   Comprehensive metabolic panel (Completed)   TSH (Completed)   GERD (gastroesophageal reflux disease)    Avoid offending foods, start probiotics. Do not eat large meals in late evening and consider raising head of bed. Better with weight loss      Relevant Orders   CBC (Completed)   Comprehensive metabolic panel (Completed)   Obesity    Encouraged heat healthy diet, decrease po intake and increase exercise as tolerated. Needs 7-8 hours of sleep nightly. Avoid trans fats, eat small, frequent meals every 4-5 hours with lean proteins, complex carbs and healthy fats. Minimize simple carbs, has had good weight loss with intermittent fasting and increased exercise.      Relevant Orders   CBC (Completed)   Comprehensive metabolic panel (Completed)   TSH (Completed)   Hyperlipidemia, mixed    Encouraged heart healthy diet, increase exercise, avoid trans fats, consider a krill  oil cap daily      Relevant Orders   Lipid panel (Completed)   Arthritis    Pain and stiffness improved with increased movement and weight loss, encouraged to continue the same      Depression with anxiety    Doing well without meds      Hemorrhoids    Have recurred. Encouraged Witch hazel  Astringent, Kegel exercises, fiber supplements twice daily         I am having Ms. Lonna CobbRomero maintain her Probiotic Product (PROBIOTIC DAILY PO), Naproxen Sodium (ALEVE PO), and rizatriptan.  Meds ordered this encounter  Medications  . rizatriptan (MAXALT) 10 MG tablet    Sig: TAKE 1 TABLET BY MOUTH AS NEEDED. MAY REPEAT ONCE IN 2 HOURS IF NEEDED    Dispense:  18 tablet    Refill:  5    CMA served as scribe during this visit. History, Physical and Plan performed by medical provider. Documentation and orders reviewed and attested to.  Danise EdgeStacey Blyth, MD

## 2017-08-21 NOTE — Assessment & Plan Note (Signed)
Doing well without meds

## 2017-08-21 NOTE — Assessment & Plan Note (Signed)
Pain and stiffness improved with increased movement and weight loss, encouraged to continue the same

## 2017-08-21 NOTE — Assessment & Plan Note (Signed)
Encouraged heat healthy diet, decrease po intake and increase exercise as tolerated. Needs 7-8 hours of sleep nightly. Avoid trans fats, eat small, frequent meals every 4-5 hours with lean proteins, complex carbs and healthy fats. Minimize simple carbs, has had good weight loss with intermittent fasting and increased exercise.

## 2017-08-21 NOTE — Assessment & Plan Note (Signed)
Have recurred. Encouraged Witch hazel  Astringent, Kegel exercises, fiber supplements twice daily

## 2017-08-21 NOTE — Assessment & Plan Note (Signed)
Encouraged heart healthy diet, increase exercise, avoid trans fats, consider a krill oil cap daily 

## 2017-08-21 NOTE — Assessment & Plan Note (Signed)
Encouraged increased hydration, 64 ounces of clear fluids daily. Minimize alcohol and caffeine. Eat small frequent meals with lean proteins and complex carbs. Avoid high and low blood sugars. Get adequate sleep, 7-8 hours a night. Needs exercise daily preferably in the morning.  

## 2017-08-21 NOTE — Patient Instructions (Signed)

## 2017-08-21 NOTE — Assessment & Plan Note (Signed)
Avoid offending foods, start probiotics. Do not eat large meals in late evening and consider raising head of bed.  Better with weight loss 

## 2017-08-24 ENCOUNTER — Encounter: Payer: Federal, State, Local not specified - PPO | Admitting: Orthotics

## 2017-08-31 ENCOUNTER — Telehealth: Payer: Self-pay | Admitting: Podiatry

## 2017-08-31 NOTE — Telephone Encounter (Signed)
Called pt to schedule an appt to pick up orthotics and voicemail is full.

## 2017-09-07 ENCOUNTER — Encounter: Payer: Federal, State, Local not specified - PPO | Admitting: Orthotics

## 2017-09-14 ENCOUNTER — Ambulatory Visit: Payer: Federal, State, Local not specified - PPO | Admitting: Orthotics

## 2017-09-14 DIAGNOSIS — G5763 Lesion of plantar nerve, bilateral lower limbs: Secondary | ICD-10-CM

## 2017-09-14 NOTE — Progress Notes (Signed)
Patient came in today to pick up custom made foot orthotics.  The goals were accomplished and the patient reported no dissatisfaction with said orthotics.  Patient was advised of breakin period and how to report any issues. 

## 2017-10-05 DIAGNOSIS — K080 Exfoliation of teeth due to systemic causes: Secondary | ICD-10-CM | POA: Diagnosis not present

## 2017-11-02 ENCOUNTER — Ambulatory Visit: Payer: Self-pay | Admitting: Family Medicine

## 2017-11-10 DIAGNOSIS — M79672 Pain in left foot: Secondary | ICD-10-CM | POA: Diagnosis not present

## 2017-11-13 ENCOUNTER — Ambulatory Visit: Payer: Federal, State, Local not specified - PPO | Admitting: Podiatry

## 2017-11-24 DIAGNOSIS — D225 Melanocytic nevi of trunk: Secondary | ICD-10-CM | POA: Diagnosis not present

## 2017-11-24 DIAGNOSIS — L918 Other hypertrophic disorders of the skin: Secondary | ICD-10-CM | POA: Diagnosis not present

## 2017-11-24 DIAGNOSIS — L821 Other seborrheic keratosis: Secondary | ICD-10-CM | POA: Diagnosis not present

## 2017-12-08 ENCOUNTER — Encounter: Payer: Self-pay | Admitting: Gastroenterology

## 2017-12-22 ENCOUNTER — Other Ambulatory Visit (HOSPITAL_COMMUNITY)
Admission: RE | Admit: 2017-12-22 | Discharge: 2017-12-22 | Disposition: A | Discharge status: Home / Self Care | Payer: Federal, State, Local not specified - PPO | Source: Ambulatory Visit | Attending: Family Medicine | Admitting: Family Medicine

## 2017-12-22 ENCOUNTER — Encounter: Payer: Self-pay | Admitting: Family Medicine

## 2017-12-22 ENCOUNTER — Ambulatory Visit (INDEPENDENT_AMBULATORY_CARE_PROVIDER_SITE_OTHER): Payer: Federal, State, Local not specified - PPO | Admitting: Family Medicine

## 2017-12-22 VITALS — BP 113/63 | HR 62 | Temp 98.5°F | Resp 18 | Ht 62.0 in | Wt 151.8 lb

## 2017-12-22 DIAGNOSIS — Z7289 Other problems related to lifestyle: Secondary | ICD-10-CM | POA: Diagnosis not present

## 2017-12-22 DIAGNOSIS — E6609 Other obesity due to excess calories: Secondary | ICD-10-CM | POA: Diagnosis not present

## 2017-12-22 DIAGNOSIS — G43909 Migraine, unspecified, not intractable, without status migrainosus: Secondary | ICD-10-CM

## 2017-12-22 DIAGNOSIS — F1090 Alcohol use, unspecified, uncomplicated: Secondary | ICD-10-CM

## 2017-12-22 DIAGNOSIS — M533 Sacrococcygeal disorders, not elsewhere classified: Secondary | ICD-10-CM | POA: Diagnosis not present

## 2017-12-22 DIAGNOSIS — N926 Irregular menstruation, unspecified: Secondary | ICD-10-CM | POA: Diagnosis not present

## 2017-12-22 DIAGNOSIS — Z124 Encounter for screening for malignant neoplasm of cervix: Secondary | ICD-10-CM | POA: Insufficient documentation

## 2017-12-22 DIAGNOSIS — E782 Mixed hyperlipidemia: Secondary | ICD-10-CM | POA: Diagnosis not present

## 2017-12-22 DIAGNOSIS — Z0000 Encounter for general adult medical examination without abnormal findings: Secondary | ICD-10-CM

## 2017-12-22 DIAGNOSIS — Z309 Encounter for contraceptive management, unspecified: Secondary | ICD-10-CM

## 2017-12-22 NOTE — Assessment & Plan Note (Signed)
Is still struggling with migraines but responds to Maxalt prn. Encouraged increased hydration, 64 ounces of clear fluids daily. Minimize alcohol and caffeine. Eat small frequent meals with lean proteins and complex carbs. Avoid high and low blood sugars. Get adequate sleep, 7-8 hours a night. Needs exercise daily preferably in the morning.

## 2017-12-22 NOTE — Assessment & Plan Note (Signed)
Patient encouraged to maintain heart healthy diet, regular exercise, adequate sleep. Consider daily probiotics. Take medications as prescribed 

## 2017-12-22 NOTE — Assessment & Plan Note (Signed)
Right sided discomfort.

## 2017-12-22 NOTE — Assessment & Plan Note (Signed)
Great weight loss since last visit

## 2017-12-22 NOTE — Progress Notes (Signed)
Subjective:  I acted as a Neurosurgeon for Dr. Abner Greenspan. [name], [name]  Patient ID: Lisa Ray, female    DOB: [birth year], 47 y.o.   MRN: [id number]  No chief complaint on file.   HPI  Patient is in today for an annual exam and she feels well today. She has been eating a heart healthy diet and she has cut down on her carbs she is exercising more and is pleased with her weight loss. She is doing well with her activities of daily living. Her daughter is doing better but her marriage is struggling still. Denies CP/palp/SOB/HA/congestion/fevers/GI or GU c/o. Taking meds as prescribed  Patient Care Team: Bradd Canary, MD as PCP - General (Family Medicine)   Past Medical History:  Diagnosis Date  . Arthritis 06/18/2014  . Bronchitis 11/05/2011  . Chicken pox as a child   X 2  . Colon polyps 2008  . Cough 06/01/2012  . Depression with anxiety 04/08/2015  . Diverticulosis 2008  . Diverticulosis 03/25/2013  . Elevated BP 05/18/2016  . Erosive inflammation of stomach lining 2008  . Foot pain, right 10/13/2011  . GERD (gastroesophageal reflux disease) 2008  . Hemorrhoids 08/21/2017  . Kidney stones 7-12   X 3  . Left shoulder pain 01/04/2012  . LVH (left ventricular hypertrophy) 05/18/2016   Echo June 2017 Mild LVH, mild MVR, stage 1 diastolic dysfunction  . Migraines   . Obesity 10/08/2011  . Ovarian cyst, right   . Preventative health care 10/13/2011  . Skin tag 06/01/2012  . Solitary pulmonary nodule 03/25/2013    Past Surgical History:  Procedure Laterality Date  . LITHOTRIPSY  2008  . OVARIAN CYST REMOVAL    . stent for kidney stone  7-12  . TONSILECTOMY, ADENOIDECTOMY, BILATERAL MYRINGOTOMY AND TUBES     Age 24    Family History  Problem Relation Age of Onset  . Hypertension Mother   . Cancer Mother        skin, melanoma  . Migraines Mother   . Heart disease Maternal Grandmother   . Colon cancer Maternal Grandmother   . Colon cancer Maternal Grandfather   . Bipolar  disorder Daughter     Social History   Socioeconomic History  . Marital status: Married    Spouse name: Lisa Ray  . Number of children: 3  . Years of education: Assoc degr  . Highest education level: Not on file  Social Needs  . Financial resource strain: Not on file  . Food insecurity - worry: Not on file  . Food insecurity - inability: Not on file  . Transportation needs - medical: Not on file  . Transportation needs - non-medical: Not on file  Occupational History  . Occupation: human resources    Comment: [employer]   Tobacco Use  . Smoking status: Never Smoker  . Smokeless tobacco: Never Used  Substance and Sexual Activity  . Alcohol use: Yes    Alcohol/week: 1.5 oz    Types: 3 Standard drinks or equivalent per week    Comment: occasionally  . Drug use: No  . Sexual activity: Yes    Partners: Male  Other Topics Concern  . Not on file  Social History Narrative   Lives with her partner Lisa Ray.           Outpatient Medications Prior to Visit  Medication Sig Dispense Refill  . Naproxen Sodium (ALEVE PO) Take by mouth.    . Probiotic Product (PROBIOTIC DAILY  PO) Take by mouth daily.    . rizatriptan (MAXALT) 10 MG tablet TAKE 1 TABLET BY MOUTH AS NEEDED. MAY REPEAT ONCE IN 2 HOURS IF NEEDED 18 tablet 5   No facility-administered medications prior to visit.     No Known Allergies  Review of Systems  Constitutional: Negative for fever and malaise/fatigue.  HENT: Negative for congestion.   Eyes: Negative for blurred vision.  Respiratory: Negative for cough and shortness of breath.   Cardiovascular: Negative for chest pain, palpitations and leg swelling.  Gastrointestinal: Negative for vomiting.  Musculoskeletal: Negative for back pain.  Skin: Negative for rash.  Neurological: Negative for loss of consciousness and headaches.       Objective:    Physical Exam  Constitutional: She is oriented to person, place, and time. She appears well-developed and  well-nourished. No distress.  HENT:  Head: Normocephalic and atraumatic.  Eyes: Conjunctivae are normal.  Neck: Normal range of motion. No thyromegaly present.  Cardiovascular: Normal rate and regular rhythm.  Pulmonary/Chest: Effort normal and breath sounds normal. She has no wheezes.  Abdominal: Soft. Bowel sounds are normal. There is no tenderness.  Musculoskeletal: Normal range of motion. She exhibits no edema or deformity.  Neurological: She is alert and oriented to person, place, and time.  Skin: Skin is warm and dry. She is not diaphoretic.  Psychiatric: She has a normal mood and affect.    BP 113/63 (BP Location: Left Arm, Patient Position: Sitting, Cuff Size: Normal)   Pulse 62   Temp 98.5 F (36.9 C) (Oral)   Resp 18   Ht 5\' 2"  (1.575 m)   Wt 151 lb 12.8 oz (68.9 kg)   LMP 11/03/2017 (Exact Date)   SpO2 100%   BMI 27.76 kg/m  Wt Readings from Last 3 Encounters:  12/22/17 151 lb 12.8 oz (68.9 kg)  08/21/17 167 lb 3.2 oz (75.8 kg)  11/10/16 203 lb 3.2 oz (92.2 kg)   BP Readings from Last 3 Encounters:  12/22/17 113/63  08/21/17 102/62  07/20/17 129/82     Immunization History  Administered Date(s) Administered  . Influenza Whole 08/04/2011  . Influenza,inj,Quad PF,6+ Mos 07/12/2013  . Influenza,inj,Quad PF,6-35 Mos 08/18/2017  . Influenza-Unspecified 08/04/2015  . Tdap 11/03/2006, 10/08/2011    Health Maintenance  Topic Date Due  . HIV Screening  06/05/1985  . PAP SMEAR  01/03/2017  . TETANUS/TDAP  10/07/2021  . INFLUENZA VACCINE  Completed    Lab Results  Component Value Date   WBC 5.5 12/22/2017   HGB 12.4 12/22/2017   HCT 37.4 12/22/2017   PLT 244.0 12/22/2017   GLUCOSE 84 12/22/2017   CHOL 153 12/22/2017   TRIG 82.0 12/22/2017   HDL 60.20 12/22/2017   LDLCALC 76 12/22/2017   ALT 16 12/22/2017   AST 21 12/22/2017   NA 139 12/22/2017   K 4.0 12/22/2017   CL 104 12/22/2017   CREATININE 0.84 12/22/2017   BUN 13 12/22/2017   CO2 30  12/22/2017   TSH 1.97 12/22/2017    Lab Results  Component Value Date   TSH 1.97 12/22/2017   Lab Results  Component Value Date   WBC 5.5 12/22/2017   HGB 12.4 12/22/2017   HCT 37.4 12/22/2017   MCV 90.2 12/22/2017   PLT 244.0 12/22/2017   Lab Results  Component Value Date   NA 139 12/22/2017   K 4.0 12/22/2017   CO2 30 12/22/2017   GLUCOSE 84 12/22/2017   BUN 13 12/22/2017  CREATININE 0.84 12/22/2017   BILITOT 0.4 12/22/2017   ALKPHOS 59 12/22/2017   AST 21 12/22/2017   ALT 16 12/22/2017   PROT 6.4 12/22/2017   ALBUMIN 4.1 12/22/2017   CALCIUM 9.3 12/22/2017   GFR 77.06 12/22/2017   Lab Results  Component Value Date   CHOL 153 12/22/2017   Lab Results  Component Value Date   HDL 60.20 12/22/2017   Lab Results  Component Value Date   LDLCALC 76 12/22/2017   Lab Results  Component Value Date   TRIG 82.0 12/22/2017   Lab Results  Component Value Date   CHOLHDL 3 12/22/2017   No results found for: HGBA1C       Assessment & Plan:   Problem List Items Addressed This Visit    Migraines    Is still struggling with migraines but responds to Maxalt prn. Encouraged increased hydration, 64 ounces of clear fluids daily. Minimize alcohol and caffeine. Eat small frequent meals with lean proteins and complex carbs. Avoid high and low blood sugars. Get adequate sleep, 7-8 hours a night. Needs exercise daily preferably in the morning.      Obesity    Great weight loss since last visit      Preventative health care    Patient encouraged to maintain heart healthy diet, regular exercise, adequate sleep. Consider daily probiotics. Take medications as prescribed      Relevant Orders   CBC (Completed)   Comprehensive metabolic panel (Completed)   TSH (Completed)   Cervical cancer screening    Pap today, no concerns on exam.       Relevant Orders   Cytology - PAP   Hyperlipidemia, mixed    Encouraged heart healthy diet, increase exercise, avoid trans fats,  consider a krill oil cap daily      Relevant Orders   Lipid panel (Completed)   Irregular menses   Relevant Orders   Ambulatory referral to Obstetrics / Gynecology   Sacroiliac dysfunction    Right sided discomfort.       Other Visit Diagnoses    Encounter for contraceptive management, unspecified type    -  Primary   Relevant Orders   Ambulatory referral to Obstetrics / Gynecology   Drinking problem       Other problems related to lifestyle       Relevant Orders   HIV antibody (Completed)      I am having Lisa Ray maintain her Probiotic Product (PROBIOTIC DAILY PO), Naproxen Sodium (ALEVE PO), and rizatriptan.  No orders of the defined types were placed in this encounter.   CMA served as Neurosurgeon during this visit. History, Physical and Plan performed by medical provider. Documentation and orders reviewed and attested to.  Danise Edge, MD

## 2017-12-22 NOTE — Assessment & Plan Note (Signed)
Encouraged heart healthy diet, increase exercise, avoid trans fats, consider a krill oil cap daily 

## 2017-12-23 LAB — COMPREHENSIVE METABOLIC PANEL
ALT: 16 U/L (ref 0–35)
AST: 21 U/L (ref 0–37)
Albumin: 4.1 g/dL (ref 3.5–5.2)
Alkaline Phosphatase: 59 U/L (ref 39–117)
BUN: 13 mg/dL (ref 6–23)
CO2: 30 meq/L (ref 19–32)
Calcium: 9.3 mg/dL (ref 8.4–10.5)
Chloride: 104 mEq/L (ref 96–112)
Creatinine, Ser: 0.84 mg/dL (ref 0.40–1.20)
GFR: 77.06 mL/min (ref >60.00)
GLUCOSE: 84 mg/dL (ref 70–99)
POTASSIUM: 4 meq/L (ref 3.5–5.1)
SODIUM: 139 meq/L (ref 135–145)
Total Bilirubin: 0.4 mg/dL (ref 0.2–1.2)
Total Protein: 6.4 g/dL (ref 6.0–8.3)

## 2017-12-23 LAB — LIPID PANEL
Cholesterol: 153 mg/dL (ref 0–200)
HDL: 60.2 mg/dL (ref >39.00)
LDL CALC: 76 mg/dL (ref 0–99)
NONHDL: 92.61
Total CHOL/HDL Ratio: 3
Triglycerides: 82 mg/dL (ref 0.0–149.0)
VLDL: 16.4 mg/dL (ref 0.0–40.0)

## 2017-12-23 LAB — CBC
HEMATOCRIT: 37.4 % (ref 36.0–46.0)
HEMOGLOBIN: 12.4 g/dL (ref 12.0–15.0)
MCHC: 33.3 g/dL (ref 30.0–36.0)
MCV: 90.2 fl (ref 78.0–100.0)
Platelets: 244 10*3/uL (ref 150.0–400.0)
RBC: 4.15 Mil/uL (ref 3.87–5.11)
RDW: 15 % (ref 11.5–15.5)
WBC: 5.5 10*3/uL (ref 4.0–10.5)

## 2017-12-23 LAB — TSH: TSH: 1.97 u[IU]/mL (ref 0.35–4.50)

## 2017-12-23 LAB — HIV ANTIBODY (ROUTINE TESTING W REFLEX): HIV 1&2 Ab, 4th Generation: NONREACTIVE

## 2017-12-23 NOTE — Assessment & Plan Note (Signed)
Pap today, no concerns on exam.  

## 2017-12-24 LAB — CYTOLOGY - PAP: DIAGNOSIS: NEGATIVE

## 2018-01-20 ENCOUNTER — Ambulatory Visit (INDEPENDENT_AMBULATORY_CARE_PROVIDER_SITE_OTHER): Payer: Federal, State, Local not specified - PPO | Admitting: Obstetrics & Gynecology

## 2018-01-20 ENCOUNTER — Encounter: Payer: Self-pay | Admitting: Obstetrics & Gynecology

## 2018-01-20 VITALS — BP 136/74 | HR 55 | Ht 62.0 in | Wt 152.0 lb

## 2018-01-20 DIAGNOSIS — N939 Abnormal uterine and vaginal bleeding, unspecified: Secondary | ICD-10-CM | POA: Diagnosis not present

## 2018-01-20 DIAGNOSIS — N760 Acute vaginitis: Secondary | ICD-10-CM

## 2018-01-20 DIAGNOSIS — B9689 Other specified bacterial agents as the cause of diseases classified elsewhere: Secondary | ICD-10-CM | POA: Diagnosis not present

## 2018-01-20 DIAGNOSIS — Z113 Encounter for screening for infections with a predominantly sexual mode of transmission: Secondary | ICD-10-CM | POA: Diagnosis not present

## 2018-01-20 NOTE — Patient Instructions (Signed)
Perimenopause Perimenopause is the time when your body begins to move into the menopause (no menstrual period for 12 straight months). It is a natural process. Perimenopause can begin 2-8 years before the menopause and usually lasts for 1 year after the menopause. During this time, your ovaries may or may not produce an egg. The ovaries vary in their production of estrogen and progesterone hormones each month. This can cause irregular menstrual periods, difficulty getting pregnant, vaginal bleeding between periods, and uncomfortable symptoms. What are the causes?  Irregular production of the ovarian hormones, estrogen and progesterone, and not ovulating every month. Other causes include:  Tumor of the pituitary gland in the brain.  Medical disease that affects the ovaries.  Radiation treatment.  Chemotherapy.  Unknown causes.  Heavy smoking and excessive alcohol intake can bring on perimenopause sooner. What are the signs or symptoms?  Hot flashes.  Night sweats.  Irregular menstrual periods.  Decreased sex drive.  Vaginal dryness.  Headaches.  Mood swings.  Depression.  Memory problems.  Irritability.  Tiredness.  Weight gain.  Trouble getting pregnant.  The beginning of losing bone cells (osteoporosis).  The beginning of hardening of the arteries (atherosclerosis). How is this diagnosed? Your health care provider will make a diagnosis by analyzing your age, menstrual history, and symptoms. He or she will do a physical exam and note any changes in your body, especially your female organs. Female hormone tests may or may not be helpful depending on the amount of female hormones you produce and when you produce them. However, other hormone tests may be helpful to rule out other problems. How is this treated? In some cases, no treatment is needed. The decision on whether treatment is necessary during the perimenopause should be made by you and your health care  provider based on how the symptoms are affecting you and your lifestyle. Various treatments are available, such as:  Treating individual symptoms with a specific medicine for that symptom.  Herbal medicines that can help specific symptoms.  Counseling.  Group therapy. Follow these instructions at home:  Keep track of your menstrual periods (when they occur, how heavy they are, how long between periods, and how long they last) as well as your symptoms and when they started.  Only take over-the-counter or prescription medicines as directed by your health care provider.  Sleep and rest.  Exercise.  Eat a diet that contains calcium (good for your bones) and soy (acts like the estrogen hormone).  Do not smoke.  Avoid alcoholic beverages.  Take vitamin supplements as recommended by your health care provider. Taking vitamin E may help in certain cases.  Take calcium and vitamin D supplements to help prevent bone loss.  Group therapy is sometimes helpful.  Acupuncture may help in some cases. Contact a health care provider if:  You have questions about any symptoms you are having.  You need a referral to a specialist (gynecologist, psychiatrist, or psychologist). Get help right away if:  You have vaginal bleeding.  Your period lasts longer than 8 days.  Your periods are recurring sooner than 21 days.  You have bleeding after intercourse.  You have severe depression.  You have pain when you urinate.  You have severe headaches.  You have vision problems. This information is not intended to replace advice given to you by your health care provider. Make sure you discuss any questions you have with your health care provider. Document Released: 11/27/2004 Document Revised: 03/27/2016 Document Reviewed: 05/19/2013 Elsevier Interactive   Patient Education  2017 Elsevier Inc.  

## 2018-01-20 NOTE — Progress Notes (Signed)
Patient reports going six months without a period. And then periods came back and have been irregular in nature ever since. Patient complaining of itching and burning with each period.  Armandina StammerJennifer Howard RNBSN

## 2018-01-20 NOTE — Progress Notes (Signed)
GYNECOLOGY OFFICE VISIT NOTE  History:  47 y.o. [id number] here today for evaluation of irregular menses.  Had amenorrhea for 6 months, then had two periods a month for the past 2-3 months. Periods are prolonged with light-medium flow.  Associated with vaginal irritation.   She denies any abnormal vaginal discharge, pelvic pain or other concerns.   Past Medical History:  Diagnosis Date  . Arthritis 06/18/2014  . Bronchitis 11/05/2011  . Chicken pox as a child   X 2  . Colon polyps 2008  . Cough 06/01/2012  . Depression with anxiety 04/08/2015  . Diverticulosis 2008  . Elevated BP 05/18/2016  . Erosive inflammation of stomach lining 2008  . Foot pain, right 10/13/2011  . GERD (gastroesophageal reflux disease) 2008  . Hemorrhoids 08/21/2017  . Kidney stones 7-12   X 3  . Left shoulder pain 01/04/2012  . LVH (left ventricular hypertrophy) 05/18/2016   Echo June 2017 Mild LVH, mild MVR, stage 1 diastolic dysfunction  . Migraines   . Obesity 10/08/2011  . Ovarian cyst, right   . Preventative health care 10/13/2011  . Skin tag 06/01/2012  . Solitary pulmonary nodule 03/25/2013    Past Surgical History:  Procedure Laterality Date  . LITHOTRIPSY  2008  . OVARIAN CYST REMOVAL    . stent for kidney stone  7-12  . TONSILECTOMY, ADENOIDECTOMY, BILATERAL MYRINGOTOMY AND TUBES     Age 47    The following portions of the patient's history were reviewed and updated as appropriate: allergies, current medications, past family history, past medical history, past social history, past surgical history and problem list.   Health Maintenance:  Normal pap on 12/22/2017.  Normal mammogram in 06/2016.  Review of Systems:  Pertinent items noted in HPI and remainder of comprehensive ROS otherwise negative.  Objective:  Physical Exam BP 136/74 (BP Location: Left Arm, Patient Position: Sitting)   Pulse (!) 55   Ht 5\' 2"  (1.575 m)   Wt 152 lb (68.9 kg)   LMP 01/20/2018   BMI 27.80 kg/m  CONSTITUTIONAL:  Well-developed, well-nourished female in no acute distress.  HENT:  Normocephalic, atraumatic. External right and left ear normal. Oropharynx is clear and moist EYES: Conjunctivae and EOM are normal. Pupils are equal, round, and reactive to light. No scleral icterus.  NECK: Normal range of motion, supple, no masses SKIN: Skin is warm and dry. No rash noted. Not diaphoretic. No erythema. No pallor. NEUROLOGIC: Alert and oriented to person, place, and time. Normal reflexes, muscle tone coordination. No cranial nerve deficit noted. PSYCHIATRIC: Normal mood and affect. Normal behavior. Normal judgment and thought content. CARDIOVASCULAR: Normal heart rate noted RESPIRATORY: Effort and breath sounds normal, no problems with respiration noted ABDOMEN: Soft, no distention noted.   PELVIC: Normal appearing external genitalia; normal appearing vaginal mucosa and cervix.  Brown discharge noted, testing sample obtained.  Normal uterine size, no other palpable masses, no uterine or adnexal tenderness. MUSCULOSKELETAL: Normal range of motion. No edema noted.  Labs and Imaging No results found.  Assessment & Plan:  1. Abnormal uterine bleeding (AUB) Likely due to perimenopause, discussed this with patient. Had normal recent hemoglobin and TSH. She has a normal exam, no evidence of lesions.  Will order infection screen and pelvic ultrasound to evaluate for any other possible etiologies.  Will contact patient with these results and plans for further evaluation/management. Discussed possibly being on progestin therapy for treatment, she defers this for now. Will discuss other management modalities as needed. -  Cervicovaginal ancillary only - US PELVIC COMPLETE WITH TRANSVAGINAL; Future Routine preventative health maintenance measures emphasized. Please refer to After Visit Summary for other counseling recommendations.   Return if symptoms worsen or fail to improve.   Total face-to-face time with patient:  15 minutes.  Over 50% of encounter was spent on counseling and coordination of care.   Jaynie CollinsUGONNA  Egbert Seidel, MD, FACOG Obstetrician & Gynecologist, [facility], [facility]

## 2018-01-22 ENCOUNTER — Encounter: Payer: Self-pay | Admitting: Obstetrics & Gynecology

## 2018-01-22 LAB — CERVICOVAGINAL ANCILLARY ONLY
BACTERIAL VAGINITIS: POSITIVE — AB
Candida vaginitis: NEGATIVE
Chlamydia: NEGATIVE
Neisseria Gonorrhea: NEGATIVE
TRICH (WINDOWPATH): NEGATIVE

## 2018-01-25 ENCOUNTER — Other Ambulatory Visit: Payer: Self-pay

## 2018-01-25 MED ORDER — FLUCONAZOLE 100 MG PO TABS: 100.0000 mg | ORAL_TABLET | Freq: Every day | ORAL | 1 refills | Status: DC

## 2018-01-25 MED ORDER — METRONIDAZOLE 500 MG PO TABS: 500.0000 mg | ORAL_TABLET | Freq: Two times a day (BID) | ORAL | 0 refills | Status: AC

## 2018-01-25 NOTE — Telephone Encounter (Signed)
Patient requested Diflucna via my chart to take with her flagyl. Armandina StammerJennifer Howard RNBSN

## 2018-01-25 NOTE — Addendum Note (Signed)
Addended by: Jaynie CollinsANYANWU, Natalio Salois A on: 01/25/2018 08:43 AM   Modules accepted: Orders

## 2018-01-27 ENCOUNTER — Ambulatory Visit (HOSPITAL_BASED_OUTPATIENT_CLINIC_OR_DEPARTMENT_OTHER): Payer: Federal, State, Local not specified - PPO

## 2018-02-15 DIAGNOSIS — N898 Other specified noninflammatory disorders of vagina: Secondary | ICD-10-CM | POA: Diagnosis not present

## 2018-04-04 ENCOUNTER — Other Ambulatory Visit: Payer: Self-pay

## 2018-04-04 ENCOUNTER — Emergency Department (HOSPITAL_COMMUNITY): Payer: Federal, State, Local not specified - PPO

## 2018-04-04 ENCOUNTER — Emergency Department (HOSPITAL_COMMUNITY)
Admission: EM | Admit: 2018-04-04 | Discharge: 2018-04-04 | Disposition: A | Discharge status: Home / Self Care | Payer: Federal, State, Local not specified - PPO | Attending: Emergency Medicine | Admitting: Emergency Medicine

## 2018-04-04 ENCOUNTER — Encounter (HOSPITAL_COMMUNITY): Payer: Self-pay | Admitting: Emergency Medicine

## 2018-04-04 DIAGNOSIS — W010XXA Fall on same level from slipping, tripping and stumbling without subsequent striking against object, initial encounter: Secondary | ICD-10-CM | POA: Insufficient documentation

## 2018-04-04 DIAGNOSIS — S59912A Unspecified injury of left forearm, initial encounter: Secondary | ICD-10-CM | POA: Diagnosis not present

## 2018-04-04 DIAGNOSIS — Y929 Unspecified place or not applicable: Secondary | ICD-10-CM | POA: Diagnosis not present

## 2018-04-04 DIAGNOSIS — S52592A Other fractures of lower end of left radius, initial encounter for closed fracture: Secondary | ICD-10-CM | POA: Diagnosis not present

## 2018-04-04 DIAGNOSIS — S52502A Unspecified fracture of the lower end of left radius, initial encounter for closed fracture: Secondary | ICD-10-CM | POA: Insufficient documentation

## 2018-04-04 DIAGNOSIS — S52612A Displaced fracture of left ulna styloid process, initial encounter for closed fracture: Secondary | ICD-10-CM | POA: Diagnosis not present

## 2018-04-04 DIAGNOSIS — Y9301 Activity, walking, marching and hiking: Secondary | ICD-10-CM | POA: Insufficient documentation

## 2018-04-04 DIAGNOSIS — I10 Essential (primary) hypertension: Secondary | ICD-10-CM | POA: Diagnosis not present

## 2018-04-04 DIAGNOSIS — Y999 Unspecified external cause status: Secondary | ICD-10-CM | POA: Insufficient documentation

## 2018-04-04 DIAGNOSIS — M25522 Pain in left elbow: Secondary | ICD-10-CM | POA: Diagnosis not present

## 2018-04-04 DIAGNOSIS — S52602A Unspecified fracture of lower end of left ulna, initial encounter for closed fracture: Secondary | ICD-10-CM

## 2018-04-04 DIAGNOSIS — S59902A Unspecified injury of left elbow, initial encounter: Secondary | ICD-10-CM | POA: Diagnosis not present

## 2018-04-04 MED ORDER — ONDANSETRON HCL 4 MG PO TABS: 4.0000 mg | ORAL_TABLET | Freq: Three times a day (TID) | ORAL | 0 refills | Status: AC | PRN

## 2018-04-04 MED ORDER — ONDANSETRON HCL 4 MG/2ML IJ SOLN
4.0000 mg | Freq: Once | INTRAMUSCULAR | Status: AC
  Administered 2018-04-04: 4 mg via INTRAVENOUS
  Filled 2018-04-04: qty 2

## 2018-04-04 MED ORDER — MORPHINE SULFATE (PF) 4 MG/ML IV SOLN
4.0000 mg | Freq: Once | INTRAVENOUS | Status: AC
  Administered 2018-04-04: 4 mg via INTRAVENOUS
  Filled 2018-04-04: qty 1

## 2018-04-04 MED ORDER — OXYCODONE-ACETAMINOPHEN 5-325 MG PO TABS
1.0000 | ORAL_TABLET | Freq: Once | ORAL | Status: AC
  Administered 2018-04-04: 1 via ORAL
  Filled 2018-04-04: qty 1

## 2018-04-04 MED ORDER — SODIUM CHLORIDE 0.9 % IV SOLN
Freq: Once | INTRAVENOUS | Status: AC
Start: 2018-04-04 — End: 2018-04-04
  Administered 2018-04-04: 13:00:00 via INTRAVENOUS

## 2018-04-04 MED ORDER — FENTANYL CITRATE (PF) 100 MCG/2ML IJ SOLN
50.0000 ug | Freq: Once | INTRAMUSCULAR | Status: AC
  Administered 2018-04-04: 50 ug via INTRAVENOUS
  Filled 2018-04-04: qty 2

## 2018-04-04 MED ORDER — OXYCODONE-ACETAMINOPHEN 5-325 MG PO TABS: 1.0000 | ORAL_TABLET | Freq: Three times a day (TID) | ORAL | 0 refills | Status: AC | PRN

## 2018-04-04 NOTE — ED Notes (Signed)
Bed: WTR5 Expected date:  Expected time:  Means of arrival:  Comments: 

## 2018-04-04 NOTE — ED Triage Notes (Signed)
Pt has obvious deformity to to l//wrist.Pt stated that she slipped and fell onto l/wrist 1 hours ago. Denies LOC, denies striking head. Tx- elevated during transport.Family at bedside.

## 2018-04-04 NOTE — Discharge Instructions (Addendum)
You will be given a prescription pain medication.  You should not drive, work, or operate machinery while taking the pain medication as it can make you very drowsy.  You may take 500mg  Tylenol every 6 hours and take one percocet every 8 hours for breakthrough pain.  This medicine may make you constipated and if you do experience constipation you may take MiraLAX to help prevent this.  You will need to call the orthopedic surgery office tomorrow or today to schedule an appointment for follow-up for tomorrow.  Return to the ER for any new or worsening symptoms in the meantime.

## 2018-04-04 NOTE — ED Provider Notes (Signed)
[facility]-EMERGENCY DEPT Provider Note   CSN: [id number] Arrival date & time: 04/04/18  1105     History   Chief Complaint Chief Complaint  Patient presents with  . Joint Swelling  . Fall    HPI Lisa Ray is a 47 y.o. female.  HPI   Patient is a 47 year old female with history of hypertension, GERD presents emergency department today complaining of left wrist pain that began suddenly after she fell while hiking prior to arrival.  Patient states she slipped on some rocks and fell onto her outstretched left hand.  Since then she has had constant pain to the left wrist that is worse with movement.  She has not taken any medications for her symptoms.  She landed on her buttocks.  She is also complaining of mild right knee pain as well.  Reports that her left hand feels cold and has some abnormal sensation however no numbness.  She denies any head trauma or LOC.  No neck pain or back pain.  No chest pain or shortness of breath.  No abdominal pain nausea or vomiting. Pt is right hand dominant.   Past Medical History:  Diagnosis Date  . Arthritis 06/18/2014  . Bronchitis 11/05/2011  . Chicken pox as a child   X 2  . Colon polyps 2008  . Cough 06/01/2012  . Depression with anxiety 04/08/2015  . Diverticulosis 2008  . Elevated BP 05/18/2016  . Erosive inflammation of stomach lining 2008  . Foot pain, right 10/13/2011  . GERD (gastroesophageal reflux disease) 2008  . Hemorrhoids 08/21/2017  . Kidney stones 7-12   X 3  . Left shoulder pain 01/04/2012  . LVH (left ventricular hypertrophy) 05/18/2016   Echo June 2017 Mild LVH, mild MVR, stage 1 diastolic dysfunction  . Migraines   . Obesity 10/08/2011  . Ovarian cyst, right   . Preventative health care 10/13/2011  . Skin tag 06/01/2012  . Solitary pulmonary nodule 03/25/2013    Patient Active Problem List   Diagnosis Date Noted  . Hemorrhoids 08/21/2017  . LVH (left ventricular hypertrophy) 05/18/2016  .  Sacroiliac dysfunction 05/18/2016  . Elevated BP 05/18/2016  . Cough 06/10/2015  . Depression with anxiety 04/08/2015  . 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 Panic attack 01/01/2015  . Arthritis 06/18/2014  . Hyperlipidemia, mixed 01/08/2014  . FH: colon cancer 01/08/2014  . Irregular menses 01/08/2014  . Cervical cancer screening 01/03/2014  . Need for prophylactic vaccination and inoculation against influenza 07/12/2013  . Irritable bowel syndrome 07/12/2013  . Diverticulosis 03/25/2013  . Solitary pulmonary nodule 03/25/2013  . 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 Chronic tension headaches 03/14/2013  . Left shoulder pain 01/04/2012  . Preventative health care 10/13/2011  . Foot pain, right 10/13/2011  . Ovarian cyst, right   . Obesity 10/08/2011  . Migraines   . Kidney stones   . Colon polyps   . GERD (gastroesophageal reflux disease)   . Erosive inflammation of stomach lining   . 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 Allergic rhinitis 02/19/2011    Past Surgical History:  Procedure Laterality Date  . LITHOTRIPSY  2008  . OVARIAN CYST REMOVAL    . stent for kidney stone  7-12  . TONSILECTOMY, ADENOIDECTOMY, BILATERAL MYRINGOTOMY AND TUBES     Age 10     OB History    Gravida  3   Para  3   Term  3   Preterm  AB      Living  1     SAB      TAB      Ectopic      Multiple      Live Births  3            Home Medications    Prior to Admission medications   Medication Sig Start Date End Date Taking? Authorizing Provider  acetaminophen (TYLENOL) 500 MG tablet Take 1,000 mg by mouth every 6 (six) hours as needed for moderate pain.   Yes [provider]  Biotin (BIOTIN 5000) 5 MG CAPS Take 5 mg by mouth daily.   Yes [provider]  CYANOCOBALAMIN PO Take 1 tablet by mouth daily.   Yes [provider]  fexofenadine (ALLEGRA) 60 MG tablet Take 60 mg by mouth daily.   Yes [provider]    fluticasone (FLONASE) 50 MCG/ACT nasal spray Place 2 sprays into both nostrils daily.   Yes [provider]  Misc Natural Products (GLUCOSAMINE CHOND COMPLEX/MSM PO) Take 1 tablet by mouth daily.   Yes [provider]  Multiple Vitamin (MULTIVITAMIN WITH MINERALS) TABS tablet Take 1 tablet by mouth daily.   Yes [provider]  Multiple Vitamins-Minerals (HAIR SKIN AND NAILS FORMULA PO) Take 1 tablet by mouth daily.   Yes [provider]  Probiotic Product (PROBIOTIC DAILY PO) Take by mouth daily.   Yes [provider]  rizatriptan (MAXALT) 10 MG tablet TAKE 1 TABLET BY MOUTH AS NEEDED. MAY REPEAT ONCE IN 2 HOURS IF NEEDED 08/21/17  Yes Bradd Canary, MD  fluconazole (DIFLUCAN) 100 MG tablet Take 1 tablet (100 mg total) by mouth daily. Patient not taking: Reported on 04/04/2018 01/25/18   Tereso Newcomer, MD  metroNIDAZOLE (FLAGYL) 500 MG tablet Take 1 tablet (500 mg total) by mouth 2 (two) times daily. Patient not taking: Reported on 04/04/2018 01/25/18   Tereso Newcomer, MD    Family History Family History  Problem Relation Age of Onset  . Hypertension Mother   . Cancer Mother        skin, melanoma  . Migraines Mother   . Heart disease Maternal Grandmother   . Colon cancer Maternal Grandmother   . Colon cancer Maternal Grandfather   . Bipolar disorder Daughter     Social History Social History   Tobacco Use  . Smoking status: Never Smoker  . Smokeless tobacco: Never Used  Substance Use Topics  . Alcohol use: Yes    Alcohol/week: 1.5 oz    Types: 3 Standard drinks or equivalent per week    Comment: occasionally  . Drug use: No     Allergies   Patient has no known allergies.   Review of Systems Review of Systems  Constitutional: Negative for fever.  Eyes: Negative for visual disturbance.  Respiratory: Negative for shortness of breath.   Cardiovascular: Negative for chest pain.  Gastrointestinal: Negative for abdominal  pain.  Genitourinary: Negative for pelvic pain.  Musculoskeletal: Negative for back pain and neck pain.       Left wrist pain  Skin: Negative for wound.  Neurological: Negative for headaches.     Physical Exam Updated Vital Signs BP 110/62 (BP Location: Right Arm)   Pulse 64   Temp 97.8 F (36.6 C) (Oral)   Resp 20   Wt 64.4 kg (142 lb)   LMP 04/03/2018 (Exact Date) Comment: 1 month period  SpO2 99%   BMI 25.97 kg/m  Physical Exam  Constitutional: She appears well-developed and well-nourished. She appears distressed.  HENT:  Head: Normocephalic and atraumatic.  Eyes: Conjunctivae are normal.  Neck: Neck supple.  Cardiovascular: Normal rate, regular rhythm, normal heart sounds and intact distal pulses.  Pulmonary/Chest: Effort normal and breath sounds normal. No stridor. She has no wheezes.  Abdominal: Soft.  Musculoskeletal:  TTP to left distal radius and ulnar with overlying swelling/deformity to distal radius. TTP throughout carpal bones diffusely. Radial and ulnar pulses intact bilat. Abnormal sensation to all fingers in left hand. Brisk cap refill to all 5 fingers. Able to move all 5 finger however ROM reduces to hand and wrist secondary to pain.   Neurological: She is alert.  Skin: Skin is warm and dry. Capillary refill takes less than 2 seconds.  Psychiatric: She has a normal mood and affect.  Nursing note and vitals reviewed.    ED Treatments / Results  Labs (all labs ordered are listed, but only abnormal results are displayed) Labs Reviewed - No data to display  EKG None  Radiology Dg Elbow Complete Left  Result Date: 04/04/2018 CLINICAL DATA:  Slipped on rocks while hiking, now with left wrist and elbow pain. EXAM: LEFT ELBOW - COMPLETE 3+ VIEW COMPARISON:  None. FINDINGS: No definite displaced fracture or elbow joint effusion given obliquity. Joint spaces appear preserved. Regional soft tissues appear normal. No radiopaque foreign body. IMPRESSION: No  fracture or elbow joint effusion. Electronically Signed   By: Simonne ComeJohn  Watts M.D.   On: 04/04/2018 12:17   Dg Forearm Left  Result Date: 04/04/2018 CLINICAL DATA:  Trauma EXAM: LEFT FOREARM - 2 VIEW COMPARISON:  Left elbow and wrist radiographs-earlier same day FINDINGS: There is a comminuted, displaced fracture involving the distal radial metaphysis, with foreshortening and angulation, apex palmar, and with extension to the distal radioulnar joint but without definitive extension to the radial carpal joint. Note is made of a minimally displaced fracture involving the ulnar styloid process. Expected adjacent soft tissue swelling.  No radiopaque foreign body. No additional fractures identified. Limited visualization of the elbow is normal given obliquity and large field of view. IMPRESSION: 1. Acute, comminuted, displaced fracture of the distal radius with extension to the distal radioulnar joint. 2. Minimally displaced fracture of the ulnar styloid process. Electronically Signed   By: Simonne ComeJohn  Watts M.D.   On: 04/04/2018 12:15   Dg Wrist Complete Left  Result Date: 04/04/2018 CLINICAL DATA:  Slipped on rocks while hiking, now with left wrist pain. EXAM: LEFT WRIST - COMPLETE 3+ VIEW COMPARISON:  Left forearm radiographs-earlier same day FINDINGS: There is a comminuted, displaced fracture involving the distal radial metaphysis, with foreshortening and angulation, apex palmar, and with extension to the distal radioulnar joint but without definitive extension to the radial carpal joint. Note is made of a minimally displaced fracture involving the ulnar styloid process. Expected adjacent soft tissue swelling and displacement of the pronator quadratus fat pad. No radiopaque foreign body. IMPRESSION: 1. Acute, comminuted, displaced fracture of the distal radius with extension to the distal radioulnar joint. 2. Minimally displaced fracture of the ulnar styloid process. Electronically Signed   By: Simonne ComeJohn  Watts M.D.   On:  04/04/2018 12:16    Procedures Procedures (including critical care time) SPLINT APPLICATION Date/Time: 1:45 PM Authorized by: Karrie Meresortni S Kayla Weekes Consent: Verbal consent obtained. Risks and benefits: risks, benefits and alternatives were discussed Consent given by: patient Splint applied by: orthopedic technician Location details: LUE Splint type: sugar tong Post-procedure: The splinted  body part was neurovascularly unchanged following the procedure. Patient tolerance: Patient tolerated the procedure well with no immediate complications.   Medications Ordered in ED Medications  fentaNYL (SUBLIMAZE) injection 50 mcg (50 mcg Intravenous Given 04/04/18 1210)  ondansetron (ZOFRAN) injection 4 mg (4 mg Intravenous Given 04/04/18 1209)  morphine 4 MG/ML injection 4 mg (4 mg Intravenous Given 04/04/18 1305)  0.9 %  sodium chloride infusion ( Intravenous New Bag/Given 04/04/18 1231)     Initial Impression / Assessment and Plan / ED Course  I have reviewed the triage vital signs and the nursing notes.  Pertinent labs & imaging results that were available during my care of the patient were reviewed by me and considered in my medical decision making (see chart for details).  1:38 PM CONSULT with Dr. Dion Saucier with hand surgery who recommends sugar tong wrist splint and f/u in the office tomorrow.   Final Clinical Impressions(s) / ED Diagnoses   Final diagnoses:  Closed fracture of distal end of left radius, unspecified fracture morphology, initial encounter  Closed fracture of distal end of left ulna, unspecified fracture morphology, initial encounter   47 year old female, right-hand-dominant, resenting after fall on outstretched hand that occurred prior to arrival.  Complaining of left wrist pain with obvious deformity on exam.  Vital signs are stable at this time.  Tenderness to distal radius and ulna with swelling and obvious deformity to distal radius.  X-ray shows comminuted, displaced fracture  of the distal radius with extension to the distal radial ulnar joint.  Also with minimally displaced fracture of ulnar styloid process.  hand surgery consulted for further recommendations with recommendations as above. Pt given pain med and advised to f/u tomorrow in office. Return precautions discussed and pt in agreement with plan. All questions answered.   ED Discharge Orders    None       Karrie Meres, [place] 04/04/18 1345    Mancel Bale, MD 04/04/18 [phone]

## 2018-04-07 ENCOUNTER — Ambulatory Visit (INDEPENDENT_AMBULATORY_CARE_PROVIDER_SITE_OTHER): Payer: Federal, State, Local not specified - PPO | Admitting: Orthopaedic Surgery

## 2018-04-07 DIAGNOSIS — M25532 Pain in left wrist: Secondary | ICD-10-CM | POA: Diagnosis not present

## 2018-04-07 DIAGNOSIS — G5612 Other lesions of median nerve, left upper limb: Secondary | ICD-10-CM | POA: Diagnosis not present

## 2018-04-07 DIAGNOSIS — S52502A Unspecified fracture of the lower end of left radius, initial encounter for closed fracture: Secondary | ICD-10-CM | POA: Diagnosis not present

## 2018-04-08 DIAGNOSIS — Y999 Unspecified external cause status: Secondary | ICD-10-CM | POA: Diagnosis not present

## 2018-04-08 DIAGNOSIS — G5602 Carpal tunnel syndrome, left upper limb: Secondary | ICD-10-CM | POA: Diagnosis not present

## 2018-04-08 DIAGNOSIS — G8918 Other acute postprocedural pain: Secondary | ICD-10-CM | POA: Diagnosis not present

## 2018-04-08 DIAGNOSIS — X58XXXA Exposure to other specified factors, initial encounter: Secondary | ICD-10-CM | POA: Diagnosis not present

## 2018-04-08 DIAGNOSIS — S52592A Other fractures of lower end of left radius, initial encounter for closed fracture: Secondary | ICD-10-CM | POA: Diagnosis not present

## 2018-04-08 DIAGNOSIS — S52571A Other intraarticular fracture of lower end of right radius, initial encounter for closed fracture: Secondary | ICD-10-CM | POA: Diagnosis not present

## 2018-04-19 DIAGNOSIS — M25532 Pain in left wrist: Secondary | ICD-10-CM | POA: Diagnosis not present

## 2018-04-19 DIAGNOSIS — Z4789 Encounter for other orthopedic aftercare: Secondary | ICD-10-CM | POA: Diagnosis not present

## 2018-04-19 DIAGNOSIS — S52502A Unspecified fracture of the lower end of left radius, initial encounter for closed fracture: Secondary | ICD-10-CM | POA: Diagnosis not present

## 2018-04-19 DIAGNOSIS — G5612 Other lesions of median nerve, left upper limb: Secondary | ICD-10-CM | POA: Diagnosis not present

## 2018-04-26 ENCOUNTER — Other Ambulatory Visit: Payer: Self-pay | Admitting: Obstetrics & Gynecology

## 2018-05-10 DIAGNOSIS — S52502D Unspecified fracture of the lower end of left radius, subsequent encounter for closed fracture with routine healing: Secondary | ICD-10-CM | POA: Diagnosis not present

## 2018-05-10 DIAGNOSIS — Z4789 Encounter for other orthopedic aftercare: Secondary | ICD-10-CM | POA: Diagnosis not present

## 2018-05-10 DIAGNOSIS — M25532 Pain in left wrist: Secondary | ICD-10-CM | POA: Diagnosis not present

## 2018-05-10 DIAGNOSIS — M25632 Stiffness of left wrist, not elsewhere classified: Secondary | ICD-10-CM | POA: Diagnosis not present

## 2018-05-18 DIAGNOSIS — M25632 Stiffness of left wrist, not elsewhere classified: Secondary | ICD-10-CM | POA: Diagnosis not present

## 2018-05-25 DIAGNOSIS — M25632 Stiffness of left wrist, not elsewhere classified: Secondary | ICD-10-CM | POA: Diagnosis not present

## 2018-05-28 DIAGNOSIS — M25632 Stiffness of left wrist, not elsewhere classified: Secondary | ICD-10-CM | POA: Diagnosis not present

## 2018-06-01 DIAGNOSIS — M25632 Stiffness of left wrist, not elsewhere classified: Secondary | ICD-10-CM | POA: Diagnosis not present

## 2018-06-04 DIAGNOSIS — M25632 Stiffness of left wrist, not elsewhere classified: Secondary | ICD-10-CM | POA: Diagnosis not present

## 2018-06-07 DIAGNOSIS — Z4789 Encounter for other orthopedic aftercare: Secondary | ICD-10-CM | POA: Diagnosis not present

## 2018-06-11 DIAGNOSIS — M25632 Stiffness of left wrist, not elsewhere classified: Secondary | ICD-10-CM | POA: Diagnosis not present

## 2018-06-24 DIAGNOSIS — M25632 Stiffness of left wrist, not elsewhere classified: Secondary | ICD-10-CM | POA: Diagnosis not present

## 2018-07-01 DIAGNOSIS — M25632 Stiffness of left wrist, not elsewhere classified: Secondary | ICD-10-CM | POA: Diagnosis not present

## 2018-07-06 DIAGNOSIS — M25532 Pain in left wrist: Secondary | ICD-10-CM | POA: Diagnosis not present

## 2018-07-07 DIAGNOSIS — S52502D Unspecified fracture of the lower end of left radius, subsequent encounter for closed fracture with routine healing: Secondary | ICD-10-CM | POA: Diagnosis not present

## 2018-07-12 DIAGNOSIS — M25632 Stiffness of left wrist, not elsewhere classified: Secondary | ICD-10-CM | POA: Diagnosis not present

## 2018-09-10 DIAGNOSIS — Z0000 Encounter for general adult medical examination without abnormal findings: Secondary | ICD-10-CM | POA: Diagnosis not present

## 2018-09-10 DIAGNOSIS — Z1239 Encounter for other screening for malignant neoplasm of breast: Secondary | ICD-10-CM | POA: Diagnosis not present

## 2018-09-10 DIAGNOSIS — L709 Acne, unspecified: Secondary | ICD-10-CM | POA: Diagnosis not present

## 2018-09-10 DIAGNOSIS — L659 Nonscarring hair loss, unspecified: Secondary | ICD-10-CM | POA: Diagnosis not present

## 2018-10-11 ENCOUNTER — Other Ambulatory Visit: Payer: Self-pay | Admitting: Family Medicine

## 2018-10-11 DIAGNOSIS — Z1231 Encounter for screening mammogram for malignant neoplasm of breast: Secondary | ICD-10-CM

## 2018-11-08 ENCOUNTER — Ambulatory Visit (HOSPITAL_BASED_OUTPATIENT_CLINIC_OR_DEPARTMENT_OTHER): Payer: Self-pay

## 2018-11-18 ENCOUNTER — Ambulatory Visit (HOSPITAL_BASED_OUTPATIENT_CLINIC_OR_DEPARTMENT_OTHER)
Admission: RE | Admit: 2018-11-18 | Discharge: 2018-11-18 | Disposition: A | Discharge status: Home / Self Care | Payer: Federal, State, Local not specified - PPO | Source: Ambulatory Visit | Attending: Family Medicine | Admitting: Family Medicine

## 2018-11-18 ENCOUNTER — Encounter (HOSPITAL_BASED_OUTPATIENT_CLINIC_OR_DEPARTMENT_OTHER): Payer: Self-pay

## 2018-11-18 DIAGNOSIS — Z1231 Encounter for screening mammogram for malignant neoplasm of breast: Secondary | ICD-10-CM | POA: Diagnosis not present

## 2018-11-29 DIAGNOSIS — L700 Acne vulgaris: Secondary | ICD-10-CM | POA: Diagnosis not present

## 2018-11-29 DIAGNOSIS — L650 Telogen effluvium: Secondary | ICD-10-CM | POA: Diagnosis not present

## 2019-09-28 DIAGNOSIS — K080 Exfoliation of teeth due to systemic causes: Secondary | ICD-10-CM | POA: Diagnosis not present

## 2020-01-07 ENCOUNTER — Ambulatory Visit: Payer: Federal, State, Local not specified - PPO | Attending: Internal Medicine

## 2020-01-07 DIAGNOSIS — Z23 Encounter for immunization: Secondary | ICD-10-CM

## 2020-01-07 NOTE — Progress Notes (Signed)
   Covid-19 Vaccination Clinic  Name:  CARRIE USERY    MRN: [id number] DOB: November 19, 1970  01/07/2020  Ms. Streiff was observed post Covid-19 immunization for 15 minutes without incident. She was provided with Vaccine Information Sheet and instruction to access the V-Safe system.   Ms. Renfro was instructed to call 911 with any severe reactions post vaccine: Marland Kitchen Difficulty breathing  . Swelling of face and throat  . A fast heartbeat  . A bad rash all over body  . Dizziness and weakness   Immunizations Administered    Name Date Dose VIS Date Route   Pfizer COVID-19 Vaccine 01/07/2020  3:45 PM 0.3 mL 10/14/2019 Intramuscular   Manufacturer: [employer], [employer]   Lot: [id number]   NDC: [address]-3

## 2020-01-24 ENCOUNTER — Other Ambulatory Visit (HOSPITAL_BASED_OUTPATIENT_CLINIC_OR_DEPARTMENT_OTHER): Payer: Self-pay | Admitting: Family Medicine

## 2020-01-24 DIAGNOSIS — Z1231 Encounter for screening mammogram for malignant neoplasm of breast: Secondary | ICD-10-CM

## 2020-01-25 ENCOUNTER — Other Ambulatory Visit: Payer: Self-pay

## 2020-01-25 ENCOUNTER — Ambulatory Visit (HOSPITAL_BASED_OUTPATIENT_CLINIC_OR_DEPARTMENT_OTHER)
Admission: RE | Admit: 2020-01-25 | Discharge: 2020-01-25 | Disposition: A | Discharge status: Home / Self Care | Payer: Federal, State, Local not specified - PPO | Source: Ambulatory Visit | Attending: Family Medicine | Admitting: Family Medicine

## 2020-01-25 DIAGNOSIS — Z1231 Encounter for screening mammogram for malignant neoplasm of breast: Secondary | ICD-10-CM | POA: Insufficient documentation

## 2020-01-28 ENCOUNTER — Ambulatory Visit: Payer: Federal, State, Local not specified - PPO | Attending: Internal Medicine

## 2020-01-28 DIAGNOSIS — Z23 Encounter for immunization: Secondary | ICD-10-CM

## 2020-01-28 NOTE — Progress Notes (Signed)
   Covid-19 Vaccination Clinic  Name:  Lisa Ray    MRN: [id number] DOB: 05-19-1971  01/28/2020  [name] was observed post Covid-19 immunization for 15 minutes without incident. She was provided with Vaccine Information Sheet and instruction to access the V-Safe system.   [name] was instructed to call 911 with any severe reactions post vaccine: Marland Kitchen Difficulty breathing  . Swelling of face and throat  . A fast heartbeat  . A bad rash all over body  . Dizziness and weakness   Immunizations Administered    Name Date Dose VIS Date Route   Pfizer COVID-19 Vaccine 01/28/2020 11:17 AM 0.3 mL 10/14/2019 Intramuscular   Manufacturer: [employer], [employer]   Lot: [id number]   NDC: [address]-8

## 2020-02-06 ENCOUNTER — Ambulatory Visit: Payer: Self-pay

## 2020-02-07 ENCOUNTER — Ambulatory Visit: Payer: Self-pay

## 2021-05-14 ENCOUNTER — Ambulatory Visit: Payer: Federal, State, Local not specified - PPO | Attending: Internal Medicine

## 2021-05-14 DIAGNOSIS — Z23 Encounter for immunization: Secondary | ICD-10-CM

## 2021-05-14 NOTE — Progress Notes (Signed)
   Covid-19 Vaccination Clinic  Name:  Lisa Ray    MRN: [id number] DOB: 03-31-1971  05/14/2021  Lisa Ray was observed post Covid-19 immunization for 15 minutes without incident. She was provided with Vaccine Information Sheet and instruction to access the V-Safe system.   Lisa Ray was instructed to call 911 with any severe reactions post vaccine: Difficulty breathing  Swelling of face and throat  A fast heartbeat  A bad rash all over body  Dizziness and weakness   Immunizations Administered     Name Date Dose VIS Date Route   PFIZER Comrnaty(Gray TOP) Covid-19 Vaccine 05/14/2021  2:40 PM 0.3 mL 10/11/2020 Intramuscular   Manufacturer: [employer], [employer]   Lot: [id number]   NDC: [phone]

## 2021-05-17 ENCOUNTER — Other Ambulatory Visit (HOSPITAL_BASED_OUTPATIENT_CLINIC_OR_DEPARTMENT_OTHER): Payer: Self-pay

## 2021-05-17 MED ORDER — COVID-19 MRNA VAC-TRIS(PFIZER) 30 MCG/0.3ML IM SUSP
INTRAMUSCULAR | 0 refills | Status: AC
  Filled 2021-05-17: qty 0.3, 1d supply, fill #0

## 2021-10-04 ENCOUNTER — Telehealth (HOSPITAL_BASED_OUTPATIENT_CLINIC_OR_DEPARTMENT_OTHER): Payer: Self-pay

## 2023-10-02 ENCOUNTER — Other Ambulatory Visit (HOSPITAL_BASED_OUTPATIENT_CLINIC_OR_DEPARTMENT_OTHER): Payer: Self-pay

## 2023-10-02 ENCOUNTER — Encounter: Payer: Self-pay | Admitting: Pharmacist

## 2023-10-02 MED ORDER — SEMAGLUTIDE-WEIGHT MANAGEMENT 0.25 MG/0.5ML ~~LOC~~ SOAJ
0.2500 mg | SUBCUTANEOUS | 3 refills | Status: AC
  Filled 2023-10-02: qty 2, 28d supply, fill #0

## 2023-10-23 ENCOUNTER — Other Ambulatory Visit (HOSPITAL_BASED_OUTPATIENT_CLINIC_OR_DEPARTMENT_OTHER): Payer: Self-pay

## 2023-10-23 MED ORDER — WEGOVY 0.5 MG/0.5ML ~~LOC~~ SOAJ
0.5000 mg | SUBCUTANEOUS | 3 refills | Status: AC
  Filled 2023-10-23: qty 2, 28d supply, fill #0
  Filled 2023-11-06: qty 2, 28d supply, fill #1

## 2023-11-06 ENCOUNTER — Other Ambulatory Visit (HOSPITAL_BASED_OUTPATIENT_CLINIC_OR_DEPARTMENT_OTHER): Payer: Self-pay
